# Patient Record
Sex: Female | Born: 1945 | Hispanic: No | Smoking: Never smoker
Health system: Southern US, Community
[De-identification: ages and names within clinical notes are randomized; demographics above are authoritative.]

## PROBLEM LIST (undated history)

## (undated) ENCOUNTER — Emergency Department (HOSPITAL_BASED_OUTPATIENT_CLINIC_OR_DEPARTMENT_OTHER): Admission: EM | Payer: Self-pay | Source: Home / Self Care

## (undated) DIAGNOSIS — I441 Atrioventricular block, second degree: Secondary | ICD-10-CM

## (undated) DIAGNOSIS — I10 Essential (primary) hypertension: Secondary | ICD-10-CM

## (undated) DIAGNOSIS — E785 Hyperlipidemia, unspecified: Secondary | ICD-10-CM

## (undated) HISTORY — DX: Atrioventricular block, second degree: I44.1

---

## 2017-05-12 ENCOUNTER — Emergency Department (HOSPITAL_COMMUNITY): Payer: Medicaid Other

## 2017-05-12 ENCOUNTER — Other Ambulatory Visit: Payer: Self-pay

## 2017-05-12 ENCOUNTER — Inpatient Hospital Stay (HOSPITAL_COMMUNITY)
Admission: EM | Admit: 2017-05-12 | Discharge: 2017-05-15 | DRG: 243 | Disposition: A | Discharge status: Home / Self Care | Payer: Medicaid Other | Attending: Internal Medicine | Admitting: Internal Medicine

## 2017-05-12 ENCOUNTER — Encounter (HOSPITAL_COMMUNITY): Payer: Self-pay | Admitting: Emergency Medicine

## 2017-05-12 DIAGNOSIS — E876 Hypokalemia: Secondary | ICD-10-CM | POA: Diagnosis not present

## 2017-05-12 DIAGNOSIS — Z959 Presence of cardiac and vascular implant and graft, unspecified: Secondary | ICD-10-CM

## 2017-05-12 DIAGNOSIS — R001 Bradycardia, unspecified: Secondary | ICD-10-CM

## 2017-05-12 DIAGNOSIS — R0609 Other forms of dyspnea: Secondary | ICD-10-CM

## 2017-05-12 DIAGNOSIS — Z7982 Long term (current) use of aspirin: Secondary | ICD-10-CM

## 2017-05-12 DIAGNOSIS — R791 Abnormal coagulation profile: Secondary | ICD-10-CM | POA: Diagnosis present

## 2017-05-12 DIAGNOSIS — I441 Atrioventricular block, second degree: Principal | ICD-10-CM | POA: Diagnosis present

## 2017-05-12 DIAGNOSIS — Z79899 Other long term (current) drug therapy: Secondary | ICD-10-CM

## 2017-05-12 DIAGNOSIS — E785 Hyperlipidemia, unspecified: Secondary | ICD-10-CM | POA: Diagnosis present

## 2017-05-12 DIAGNOSIS — Z9289 Personal history of other medical treatment: Secondary | ICD-10-CM

## 2017-05-12 DIAGNOSIS — R5383 Other fatigue: Secondary | ICD-10-CM

## 2017-05-12 DIAGNOSIS — J9811 Atelectasis: Secondary | ICD-10-CM | POA: Diagnosis present

## 2017-05-12 DIAGNOSIS — R531 Weakness: Secondary | ICD-10-CM | POA: Diagnosis present

## 2017-05-12 DIAGNOSIS — E7800 Pure hypercholesterolemia, unspecified: Secondary | ICD-10-CM | POA: Diagnosis present

## 2017-05-12 DIAGNOSIS — I10 Essential (primary) hypertension: Secondary | ICD-10-CM | POA: Diagnosis present

## 2017-05-12 DIAGNOSIS — R110 Nausea: Secondary | ICD-10-CM | POA: Diagnosis not present

## 2017-05-12 DIAGNOSIS — I4891 Unspecified atrial fibrillation: Secondary | ICD-10-CM | POA: Diagnosis present

## 2017-05-12 HISTORY — DX: Hyperlipidemia, unspecified: E78.5

## 2017-05-12 HISTORY — DX: Essential (primary) hypertension: I10

## 2017-05-12 LAB — CBC WITH DIFFERENTIAL/PLATELET
BASOS ABS: 0 10*3/uL (ref 0.0–0.1)
BASOS PCT: 0 %
EOS ABS: 0.1 10*3/uL (ref 0.0–0.7)
EOS PCT: 1 %
HEMATOCRIT: 38.1 % (ref 36.0–46.0)
Hemoglobin: 12 g/dL (ref 12.0–15.0)
Lymphocytes Relative: 34 %
Lymphs Abs: 3.1 10*3/uL (ref 0.7–4.0)
MCH: 30.5 pg (ref 26.0–34.0)
MCHC: 31.5 g/dL (ref 30.0–36.0)
MCV: 96.7 fL (ref 78.0–100.0)
MONO ABS: 0.8 10*3/uL (ref 0.1–1.0)
MONOS PCT: 9 %
NEUTROS ABS: 5 10*3/uL (ref 1.7–7.7)
Neutrophils Relative %: 56 %
PLATELETS: 180 10*3/uL (ref 150–400)
RBC: 3.94 MIL/uL (ref 3.87–5.11)
RDW: 13.6 % (ref 11.5–15.5)
WBC: 9 10*3/uL (ref 4.0–10.5)

## 2017-05-12 LAB — COMPREHENSIVE METABOLIC PANEL
ALBUMIN: 3.8 g/dL (ref 3.5–5.0)
ALT: 28 U/L (ref 14–54)
ANION GAP: 6 (ref 5–15)
AST: 33 U/L (ref 15–41)
Alkaline Phosphatase: 72 U/L (ref 38–126)
BILIRUBIN TOTAL: 1 mg/dL (ref 0.3–1.2)
BUN: 21 mg/dL — AB (ref 6–20)
CHLORIDE: 112 mmol/L — AB (ref 101–111)
CO2: 23 mmol/L (ref 22–32)
Calcium: 9 mg/dL (ref 8.9–10.3)
Creatinine, Ser: 0.91 mg/dL (ref 0.44–1.00)
GFR calc Af Amer: >60 mL/min (ref >60)
GFR calc non Af Amer: >60 mL/min (ref >60)
GLUCOSE: 111 mg/dL — AB (ref 65–99)
POTASSIUM: 3.9 mmol/L (ref 3.5–5.1)
SODIUM: 141 mmol/L (ref 135–145)
TOTAL PROTEIN: 7.1 g/dL (ref 6.5–8.1)

## 2017-05-12 LAB — URINALYSIS, ROUTINE W REFLEX MICROSCOPIC
BILIRUBIN URINE: NEGATIVE
Bacteria, UA: NONE SEEN
Glucose, UA: NEGATIVE mg/dL
KETONES UR: NEGATIVE mg/dL
LEUKOCYTES UA: NEGATIVE
NITRITE: NEGATIVE
PH: 6 (ref 5.0–8.0)
Protein, ur: NEGATIVE mg/dL
SPECIFIC GRAVITY, URINE: 1.003 — AB (ref 1.005–1.030)
Squamous Epithelial / LPF: NONE SEEN

## 2017-05-12 LAB — I-STAT TROPONIN, ED: TROPONIN I, POC: 0 ng/mL (ref 0.00–0.08)

## 2017-05-12 NOTE — ED Triage Notes (Signed)
Patient reports exertional dyspnea , fatigue , generalized weakness and right calf pain onset this week. Denies fever or chills .

## 2017-05-13 ENCOUNTER — Other Ambulatory Visit: Payer: Self-pay

## 2017-05-13 DIAGNOSIS — R001 Bradycardia, unspecified: Secondary | ICD-10-CM | POA: Diagnosis present

## 2017-05-13 DIAGNOSIS — E7800 Pure hypercholesterolemia, unspecified: Secondary | ICD-10-CM | POA: Diagnosis present

## 2017-05-13 DIAGNOSIS — R110 Nausea: Secondary | ICD-10-CM | POA: Diagnosis not present

## 2017-05-13 DIAGNOSIS — Z7982 Long term (current) use of aspirin: Secondary | ICD-10-CM | POA: Diagnosis not present

## 2017-05-13 DIAGNOSIS — R531 Weakness: Secondary | ICD-10-CM | POA: Diagnosis present

## 2017-05-13 DIAGNOSIS — I441 Atrioventricular block, second degree: Secondary | ICD-10-CM | POA: Diagnosis present

## 2017-05-13 DIAGNOSIS — E785 Hyperlipidemia, unspecified: Secondary | ICD-10-CM | POA: Diagnosis present

## 2017-05-13 DIAGNOSIS — Z79899 Other long term (current) drug therapy: Secondary | ICD-10-CM | POA: Diagnosis not present

## 2017-05-13 DIAGNOSIS — J9811 Atelectasis: Secondary | ICD-10-CM | POA: Diagnosis present

## 2017-05-13 DIAGNOSIS — I10 Essential (primary) hypertension: Secondary | ICD-10-CM | POA: Diagnosis present

## 2017-05-13 DIAGNOSIS — R791 Abnormal coagulation profile: Secondary | ICD-10-CM | POA: Diagnosis present

## 2017-05-13 DIAGNOSIS — I4891 Unspecified atrial fibrillation: Secondary | ICD-10-CM | POA: Diagnosis present

## 2017-05-13 DIAGNOSIS — E876 Hypokalemia: Secondary | ICD-10-CM | POA: Diagnosis not present

## 2017-05-13 LAB — MRSA PCR SCREENING: MRSA by PCR: NEGATIVE

## 2017-05-13 LAB — CBC
HCT: 34.4 % — ABNORMAL LOW (ref 36.0–46.0)
HEMOGLOBIN: 10.7 g/dL — AB (ref 12.0–15.0)
MCH: 29.4 pg (ref 26.0–34.0)
MCHC: 31.1 g/dL (ref 30.0–36.0)
MCV: 94.5 fL (ref 78.0–100.0)
Platelets: 165 10*3/uL (ref 150–400)
RBC: 3.64 MIL/uL — AB (ref 3.87–5.11)
RDW: 13.4 % (ref 11.5–15.5)
WBC: 8.8 10*3/uL (ref 4.0–10.5)

## 2017-05-13 LAB — CREATININE, SERUM
CREATININE: 0.79 mg/dL (ref 0.44–1.00)
GFR calc Af Amer: >60 mL/min (ref >60)

## 2017-05-13 LAB — D-DIMER, QUANTITATIVE: D-Dimer, Quant: 1.47 ug/mL-FEU — ABNORMAL HIGH (ref 0.00–0.50)

## 2017-05-13 LAB — TSH: TSH: 2.964 u[IU]/mL (ref 0.350–4.500)

## 2017-05-13 MED ORDER — IRBESARTAN 150 MG PO TABS
150.0000 mg | ORAL_TABLET | Freq: Every day | ORAL | Status: DC
  Administered 2017-05-13 – 2017-05-15 (×3): 150 mg via ORAL
  Filled 2017-05-13 (×2): qty 1

## 2017-05-13 MED ORDER — METOPROLOL SUCCINATE ER 100 MG PO TB24: 100.0000 mg | ORAL_TABLET | Freq: Every day | ORAL | Status: DC

## 2017-05-13 MED ORDER — IRBESARTAN 150 MG PO TABS
300.0000 mg | ORAL_TABLET | Freq: Every day | ORAL | Status: DC
  Filled 2017-05-13: qty 2

## 2017-05-13 MED ORDER — ACETAMINOPHEN 325 MG PO TABS: 650.0000 mg | ORAL_TABLET | Freq: Four times a day (QID) | ORAL | Status: DC | PRN

## 2017-05-13 MED ORDER — FELODIPINE ER 2.5 MG PO TB24
2.5000 mg | ORAL_TABLET | Freq: Every day | ORAL | Status: DC
  Administered 2017-05-13 – 2017-05-15 (×3): 2.5 mg via ORAL
  Filled 2017-05-13 (×3): qty 1

## 2017-05-13 MED ORDER — ENOXAPARIN SODIUM 40 MG/0.4ML ~~LOC~~ SOLN
40.0000 mg | SUBCUTANEOUS | Status: DC
  Administered 2017-05-13: 40 mg via SUBCUTANEOUS
  Filled 2017-05-13: qty 0.4

## 2017-05-13 MED ORDER — ACETAMINOPHEN 650 MG RE SUPP: 650.0000 mg | Freq: Four times a day (QID) | RECTAL | Status: DC | PRN

## 2017-05-13 MED ORDER — METOCLOPRAMIDE HCL 5 MG/ML IJ SOLN
10.0000 mg | Freq: Once | INTRAMUSCULAR | Status: AC
  Administered 2017-05-13: 10 mg via INTRAVENOUS
  Filled 2017-05-13: qty 2

## 2017-05-13 MED ORDER — ATORVASTATIN CALCIUM 20 MG PO TABS
20.0000 mg | ORAL_TABLET | Freq: Every day | ORAL | Status: DC
Start: 2017-05-13 — End: 2017-05-15
  Administered 2017-05-13 – 2017-05-14 (×2): 20 mg via ORAL
  Filled 2017-05-13 (×3): qty 1

## 2017-05-13 MED ORDER — ASPIRIN EC 81 MG PO TBEC
81.0000 mg | DELAYED_RELEASE_TABLET | Freq: Every day | ORAL | Status: DC
  Administered 2017-05-13: 81 mg via ORAL
  Filled 2017-05-13: qty 1

## 2017-05-13 MED ORDER — SODIUM CHLORIDE 0.9 % IV SOLN
INTRAVENOUS | Status: DC
  Administered 2017-05-13 – 2017-05-14 (×3): via INTRAVENOUS

## 2017-05-13 MED ORDER — FUROSEMIDE 10 MG/ML IJ SOLN: 40.0000 mg | Freq: Once | INTRAMUSCULAR | Status: DC

## 2017-05-13 NOTE — ED Provider Notes (Signed)
[facility] Note   CSN: [id number] Arrival date & time: 05/12/17  2129     History   Chief Complaint Chief Complaint  Patient presents with  . Shortness of Breath  . Fatigue    HPI Brandi Ayers is a 71 y.o. female.  The history is provided by the patient. A language interpreter was used.  She has history of hypertension and hyperlipidemia, and is visiting from [place].  Over the last several days, she has noted shortness of breath when she walks and feeling generally weak.  She denies chest pain, heaviness, tightness, pressure.  She denies nausea or vomiting.  She denies feeling dizzy or lightheaded.  On review of her medications, she is on a long-acting version of metoprolol.  Past Medical History:  Diagnosis Date  . Hyperlipidemia   . Hypertension     There are no active problems to display for this patient.   ** The histories are not reviewed yet. Please review them in the "History" navigator section and refresh this SmartLink.  OB History    No data available       Home Medications    Prior to Admission medications   Not on File    Family History No family history on file.  Social History Social History   Tobacco Use  . Smoking status: Never Smoker  . Smokeless tobacco: Never Used  Substance Use Topics  . Alcohol use: No    Frequency: Never  . Drug use: No     Allergies   Patient has no known allergies.   Review of Systems Review of Systems  All other systems reviewed and are negative.    Physical Exam Updated Vital Signs BP (!) 173/53   Pulse (!) 37   Temp 98.6 F (37 C) (Oral)   Resp (!) 23   SpO2 98%   Physical Exam  Nursing note and vitals reviewed.  71 year old female, resting comfortably and in no acute distress. Vital signs are significant for bradycardia, tachypnea, hypertension. Oxygen saturation is 98%, which is normal. Head is normocephalic and atraumatic. PERRLA,  EOMI. Oropharynx is clear. Neck is nontender and supple without adenopathy or JVD. Back is nontender and there is no CVA tenderness. Lungs are clear without rales, wheezes, or rhonchi. Chest is nontender. Heart is bradycardic without murmur. Abdomen is soft, flat, nontender without masses or hepatosplenomegaly and peristalsis is normoactive. Extremities have no cyanosis or edema, full range of motion is present. Skin is warm and dry without rash. Neurologic: Mental status is normal, cranial nerves are intact, there are no motor or sensory deficits.  ED Treatments / Results  Labs (all labs ordered are listed, but only abnormal results are displayed) Labs Reviewed  COMPREHENSIVE METABOLIC PANEL - Abnormal; Notable for the following components:      Result Value   Chloride 112 (*)    Glucose, Bld 111 (*)    BUN 21 (*)    All other components within normal limits  URINALYSIS, ROUTINE W REFLEX MICROSCOPIC - Abnormal; Notable for the following components:   Color, Urine COLORLESS (*)    Specific Gravity, Urine 1.003 (*)    Hgb urine dipstick SMALL (*)    All other components within normal limits  CBC WITH DIFFERENTIAL/PLATELET  I-STAT TROPONIN, ED    EKG  EKG Interpretation  Date/Time:  Thursday May 12 2017 21:34:37 EST Ventricular Rate:  42 PR Interval:  274 QRS Duration: 140 QT Interval:  542  QTC Calculation: 452 R Axis:   67 Text Interpretation:  Marked sinus bradycardia with 1st degree A-V block Right bundle branch block Abnormal ECG No old tracing to compare Confirmed by Riki Gehring ([id number]) on 05/13/2017 12:59:03 AM  Dione Booze     Radiology Dg Chest 2 View  Result Date: 05/12/2017 CLINICAL DATA:  Acute onset of shortness of breath with exertion. Generalized weakness and fatigue. EXAM: CHEST  2 VIEW COMPARISON:  None. FINDINGS: The lungs are well-aerated. Vascular congestion is noted. Increased interstitial markings may reflect mild interstitial edema. A small 1.0 cm  nodular opacity is suggested at the left hilum. There is no evidence of pleural effusion or pneumothorax. The heart is borderline normal in size. No acute osseous abnormalities are seen. IMPRESSION: 1. Vascular congestion. Increased interstitial markings may reflect mild interstitial edema. 2. Small 1.0 cm nodular opacity suggested at the left hilum. Followup PA and lateral chest X-ray is recommended in 3-4 weeks following treatment of interstitial edema to ensure resolution and exclude underlying malignancy. Electronically Signed   By: Roanna RaiderJeffery  Chang M.D.   On: 05/12/2017 22:14    Procedures Procedures (including critical care time)  Medications Ordered in ED Medications - No data to display   Initial Impression / Assessment and Plan / ED Course  I have reviewed the triage vital signs and the nursing notes.  Pertinent labs & imaging results that were available during my care of the patient were reviewed by me and considered in my medical decision making (see chart for details).  Fatigue and dyspnea with severe bradycardia.  Bradycardia is likely the source of her symptoms.  She is on a beta-blocker, so that is the most likely cause.  Also need to consider possible underlying sick sinus syndrome.  She will need to be admitted for cardiac monitoring.  If bradycardia does not respond to holding beta-blocker, may need to consider permanent pacemaker.  Case is discussed with Dr. Toniann FailKakrakandy, of [facility], who agrees to admit the patient.  He does request cardiology consultation and TSH level.  These are ordered.  Final Clinical Impressions(s) / ED Diagnoses   Final diagnoses:  Sinus bradycardia  Weakness  Fatigue, unspecified type  Dyspnea on exertion    ED Discharge Orders    None       Dione BoozeGlick, Honore Wipperfurth, MD 05/13/17 [phone]

## 2017-05-13 NOTE — Progress Notes (Signed)
Patient with c/o nausea. Blount on team notified and ordered Reglan for nausea. Patient lying in bed. Report of HR dropping to 29 with a 2.86 sec pause reported to Office Depot[employer]ount. See strip. Will continue to monitor closely.

## 2017-05-13 NOTE — Consult Note (Signed)
Cardiology Consultation:   Patient ID: Brandi Ayers; [id number]; 04/11/1946   Admit date: 05/12/2017 Date of Consult: 05/13/2017  Primary Care Provider: Patient, No Pcp Per Primary Cardiologist: No primary care provider on file.  Primary Electrophysiologist:  none  Patient Profile:   Brandi Ayers is a 71 y.o. female with a hx of HTN, who is being seen today for the evaluation of fatigue, bradycardia  at the request of ER MD  Dr Preston FleetingGlick.  History of Present Illness:   Brandi Ayers  is a 71 y.o. female.  The history is provided by the patient. A language interpreter was used.   She has history of hypertension and hyperlipidemia, and is visiting from [place].    Over the last several days, she has noted shortness of breath when she walks and feeling generally weak.  She denies chest pain, heaviness, tightness, pressure.  She denies nausea or vomiting.  She denies feeling dizzy or lightheaded.  On review of her medications, she is on a long-acting version of metoprolol.  H/o  Right leg/calf  Pain , from nursing  History  Noted.  Awake, alert, oriented and  Vitals  130/60  Pulse  38-40 EKG- Possible  Underlying  Wenckeback, 2:1AV block   Social History:   Social History   Socioeconomic History  . Marital status: Widowed    Spouse name: Not on file  . Number of children: Not on file  . Years of education: Not on file  . Highest education level: Not on file  Social Needs  . Financial resource strain: Not on file  . Food insecurity - worry: Not on file  . Food insecurity - inability: Not on file  . Transportation needs - medical: Not on file  . Transportation needs - non-medical: Not on file  Occupational History  . Not on file  Tobacco Use  . Smoking status: Never Smoker  . Smokeless tobacco: Never Used  Substance and Sexual Activity  . Alcohol use: No    Frequency: Never  . Drug use: No  . Sexual activity: Not on file  Other Topics Concern  . Not on file  Social  History Narrative  . Not on file    Family History:   No family history on file.   ROS:  Please see the history of present illness.  ROS  All other ROS reviewed and negative.   , except  For  WEAKNESS, Fatigue  Physical Exam/Data:   Vitals:   05/13/17 0230 05/13/17 0300 05/13/17 0315 05/13/17 0330  BP: (!) 153/55 (!) 149/55 (!) 146/57 (!) 136/53  Pulse: (!) 35 (!) 34 (!) 30 (!) 34  Resp: 18 (!) 25 18 13   Temp:      TempSrc:      SpO2: 96% 96% 93% 96%   No intake or output data in the 24 hours ending 05/13/17 0415 There were no vitals filed for this visit. There is no height or weight on file to calculate BMI.  General:  Well nourished, well developed,  Appears  fatigued HEENT: normal Lymph: no adenopathy Neck: no JVD Endocrine:  No thryomegaly Vascular: No carotid bruits; FA pulses 2+ bilaterally without bruits  Cardiac:  normal S1, S2; RRR; no murmur Lungs:  clear to auscultation bilaterally, no wheezing, rhonchi or rales  Abd: soft, nontender, no hepatomegaly  Ext: no edema Musculoskeletal:  No deformities, BUE and BLE strength normal and equal Skin: warm and dry  Neuro:  CNs 2-12 intact, no focal abnormalities noted Psych:  Normal affect   EKG:  The EKG was personally reviewed and demonstrates:  2:1  AV  Block , Sinus  Bradycardia, Non  Specific ST  Changes, RBBB  Telemetry:  Telemetry was personally reviewed and demonstrates:   BRADYCARDIA  Relevant CV Studies:  No prior EKG  Or  Cath results Laboratory Data:  Chemistry Recent Labs  Lab 05/12/17 2146  NA 141  K 3.9  CL 112*  CO2 23  GLUCOSE 111*  BUN 21*  CREATININE 0.91  CALCIUM 9.0  GFRNONAA >60  GFRAA >60  ANIONGAP 6    Recent Labs  Lab 05/12/17 2146  PROT 7.1  ALBUMIN 3.8  AST 33  ALT 28  ALKPHOS 72  BILITOT 1.0   Hematology Recent Labs  Lab 05/12/17 2146  WBC 9.0  RBC 3.94  HGB 12.0  HCT 38.1  MCV 96.7  MCH 30.5  MCHC 31.5  RDW 13.6  PLT 180   Cardiac EnzymesNo results  for input(s): TROPONINI in the last 168 hours.  Recent Labs  Lab 05/12/17 2207  TROPIPOC 0.00    BNPNo results for input(s): BNP, PROBNP in the last 168 hours.  DDimer No results for input(s): DDIMER in the last 168 hours. D- DIMER  REPORTED  AS 1.47 at  approx  5 am   Radiology/Studies:  Dg Chest 2 View  Result Date: 05/12/2017 CLINICAL DATA:  Acute onset of shortness of breath with exertion. Generalized weakness and fatigue. EXAM: CHEST  2 VIEW COMPARISON:  None. FINDINGS: The lungs are well-aerated. Vascular congestion is noted. Increased interstitial markings may reflect mild interstitial edema. A small 1.0 cm nodular opacity is suggested at the left hilum. There is no evidence of pleural effusion or pneumothorax. The heart is borderline normal in size. No acute osseous abnormalities are seen. IMPRESSION: 1. Vascular congestion. Increased interstitial markings may reflect mild interstitial edema. 2. Small 1.0 cm nodular opacity suggested at the left hilum. Followup PA and lateral chest X-ray is recommended in 3-4 weeks following treatment of interstitial edema to ensure resolution and exclude underlying malignancy. Electronically Signed   By: Roanna RaiderJeffery  Chang M.D.   On: 05/12/2017 22:14    Assessment and Plan:   BRADYCARDIA   Appears to be  2:1 AV  Block vs  Underlying  Mobitz Type 2, RBBB,    No Hypotension   D- dimer  , recommended  And Either V/Q or CT A , PE  Study  Recommended, especially  As  There  Are  Subtle symptoms of  Exertional dyspnea,  And  >12 hrs  Long  Distance travel.   D/W ER , if  Any  Hypotension arises, consider  Dopamine gtt Echo , cardiac  Enzymes,  EKG  With symptoms  Requested.TSH  ordered Will suggest EP input, to review  Further. Metoprolol on  Hold.   HTN-  Well controlled  Hyperlipidemia- continue  Atorvastatin  Elevated D-Dimer, with questionable h/o Right calf  Pain- Will recommend  DVT  Study  And  Also PE Rule out, as symptoms of Exertional  dyspnea  Are  Present,  Conveyed  recs to ER   For questions or updates, please contact [facility] Please consult [email or web address] for contact info under Cardiology/STEMI.   Signed, Lynwood DawleyAmeeth Kizer Nobbe, MD  05/13/2017 4:15 AM

## 2017-05-13 NOTE — H&P (Signed)
History and Physical    Brandi Ayers AVW:[id number] DOB: 1945-09-21 DOA: 05/12/2017  PCP: Patient, No Pcp Per Patient coming from: home.  Pt lives here and in [place]  Chief Complaint: fatigue, weakness  HPI: Brandi Ayers is a 71 y.o. female with medical history significant of hypertension and hyperlipidemia.  Pt reports she has been short of breath when she is walking for several days.  Pt reports she feels weak all over.  Pt had not noticed that her heart rate was low.  Pt denies any recent illness.  No cough, no fever or chills.  She has not experienced any chest pain.  No history of syncope.  Pt feels her health is over all good.   Pt reports she has no symptoms at rest.  She is visiting here from [place].  Pt reports she lives here and in [place].  No MD here .  Pt has Md in [place].  Pt is on a long acting metoprolol for her blood pressure control.      ED Course: Pt seen and placed on  A monitor in ED.  Pt's heart rate in the 30-40's.    Review of Systems  Constitutional: Negative for chills and fever.  HENT: Negative for congestion, sinus pain and sore throat.   Eyes: Negative.   Respiratory: Negative for cough.   Cardiovascular: Negative for chest pain, palpitations and leg swelling.  Gastrointestinal: Negative for abdominal pain and nausea.  Genitourinary: Negative for dysuria and urgency.  Musculoskeletal: Negative for myalgias.  Skin: Negative for rash.  Neurological: Negative for dizziness, focal weakness and headaches.  Endo/Heme/Allergies: Negative for polydipsia.  Psychiatric/Behavioral: Negative for depression.  All other systems reviewed and are negative.    Ambulatory Status:ambulatory, needs assistance due to low heart rate currently  Past Medical History:  Diagnosis Date  . Hyperlipidemia   . Hypertension       Social History   Socioeconomic History  . Marital status: Widowed    Spouse name: Not on file  . Number of children: Not on file  . Years  of education: Not on file  . Highest education level: Not on file  Social Needs  . Financial resource strain: Not on file  . Food insecurity - worry: Not on file  . Food insecurity - inability: Not on file  . Transportation needs - medical: Not on file  . Transportation needs - non-medical: Not on file  Occupational History  . Not on file  Tobacco Use  . Smoking status: Never Smoker  . Smokeless tobacco: Never Used  Substance and Sexual Activity  . Alcohol use: No    Frequency: Never  . Drug use: No  . Sexual activity: Not on file  Other Topics Concern  . Not on file  Social History Narrative  . Not on file    No Known Allergies  No family history on file.  Prior to Admission medications   Medication Sig Start Date End Date Taking? Authorizing Provider  aspirin EC 81 MG tablet Take 81 mg by mouth daily.   Yes [provider]  atorvastatin (LIPITOR) 20 MG tablet Take 20 mg by mouth daily.   Yes [provider]  candesartan (ATACAND) 16 MG tablet Take 16 mg by mouth 2 (two) times daily.   Yes [provider]  felodipine (PLENDIL) 5 MG 24 hr tablet Take 2.5 mg by mouth daily.   Yes [provider]  metoprolol succinate (TOPROL-XL) 100 MG 24 hr tablet Take 100  mg by mouth daily. Take with or immediately following a meal.   Yes [provider]    Physical Exam: Vitals:   05/13/17 0615 05/13/17 0616 05/13/17 0733 05/13/17 0837  BP:  (!) 167/59 (!) 153/61 (!) 146/51  Pulse:  (!) 33 (!) 34 (!) 32  Resp:  (!) 21 20 (!) 23  Temp: 98.3 F (36.8 C)  98.2 F (36.8 C)   TempSrc: Oral  Oral   SpO2:  96% 96% 95%  Weight: 83.7 kg (184 lb 8.4 oz)        General:  Appears calm and comfortable Eyes:  PERRL, EOMI, normal lids, iris ENT:  grossly normal hearing, lips & tongue, mmm Neck:  no LAD, masses or thyromegaly Cardiovascular:  Bradycardia 38no m/r/g. No LE edema.  Respiratory:   CTA bilaterally, no w/r/r. Normal respiratory  effort. Abdomen:   soft, ntnd, NABS Skin:   no rash or induration seen on limited exam Musculoskeletal:   grossly normal tone BUE/BLE, good ROM, no bony abnormality Psychiatric:   grossly normal mood and affect, speech fluent and appropriate, AOx3 Neurologic:   CN 2-12 grossly intact, moves all extremities in coordinated fashion, sensation intact  Labs on Admission: I have personally reviewed following labs and imaging studies  CBC: Recent Labs  Lab 05/12/17 2146  WBC 9.0  NEUTROABS 5.0  HGB 12.0  HCT 38.1  MCV 96.7  PLT 180   Basic Metabolic Panel: Recent Labs  Lab 05/12/17 2146  NA 141  K 3.9  CL 112*  CO2 23  GLUCOSE 111*  BUN 21*  CREATININE 0.91  CALCIUM 9.0   GFR: CrCl cannot be calculated (Unknown ideal weight.). Liver Function Tests: Recent Labs  Lab 05/12/17 2146  AST 33  ALT 28  ALKPHOS 72  BILITOT 1.0  PROT 7.1  ALBUMIN 3.8   No results for input(s): LIPASE, AMYLASE in the last 168 hours. No results for input(s): AMMONIA in the last 168 hours. Coagulation Profile: No results for input(s): INR, PROTIME in the last 168 hours. Cardiac Enzymes: No results for input(s): CKTOTAL, CKMB, CKMBINDEX, TROPONINI in the last 168 hours. BNP (last 3 results) No results for input(s): PROBNP in the last 8760 hours. HbA1C: No results for input(s): HGBA1C in the last 72 hours. CBG: No results for input(s): GLUCAP in the last 168 hours. Lipid Profile: No results for input(s): CHOL, HDL, LDLCALC, TRIG, CHOLHDL, LDLDIRECT in the last 72 hours. Thyroid Function Tests: Recent Labs    05/13/17 0208  TSH 2.964   Anemia Panel: No results for input(s): VITAMINB12, FOLATE, FERRITIN, TIBC, IRON, RETICCTPCT in the last 72 hours. Urine analysis:    Component Value Date/Time   COLORURINE COLORLESS (A) 05/12/2017 2141   APPEARANCEUR CLEAR 05/12/2017 2141   LABSPEC 1.003 (L) 05/12/2017 2141   PHURINE 6.0 05/12/2017 2141   GLUCOSEU NEGATIVE 05/12/2017 2141    HGBUR SMALL (A) 05/12/2017 2141   BILIRUBINUR NEGATIVE 05/12/2017 2141   KETONESUR NEGATIVE 05/12/2017 2141   PROTEINUR NEGATIVE 05/12/2017 2141   NITRITE NEGATIVE 05/12/2017 2141   LEUKOCYTESUR NEGATIVE 05/12/2017 2141    Creatinine Clearance: CrCl cannot be calculated (Unknown ideal weight.).  Sepsis Labs: @LABRCNTIP (procalcitonin:4,lacticidven:4) )No results found for this or any previous visit (from the past 240 hour(s)).   Radiological Exams on Admission: Dg Chest 2 View  Result Date: 05/12/2017 CLINICAL DATA:  Acute onset of shortness of breath with exertion. Generalized weakness and fatigue. EXAM: CHEST  2 VIEW COMPARISON:  None. FINDINGS: The lungs are  well-aerated. Vascular congestion is noted. Increased interstitial markings may reflect mild interstitial edema. A small 1.0 cm nodular opacity is suggested at the left hilum. There is no evidence of pleural effusion or pneumothorax. The heart is borderline normal in size. No acute osseous abnormalities are seen. IMPRESSION: 1. Vascular congestion. Increased interstitial markings may reflect mild interstitial edema. 2. Small 1.0 cm nodular opacity suggested at the left hilum. Followup PA and lateral chest X-ray is recommended in 3-4 weeks following treatment of interstitial edema to ensure resolution and exclude underlying malignancy. Electronically Signed   By: Roanna RaiderJeffery  Chang M.D.   On: 05/12/2017 22:14    EKG: Independently reviewed. Bradycardia  Assessment/Plan Active Problems:   Bradycardia  --Bradycardia may be due to metoprolol.  I will hold metoprolol to see if bradycardia improves.  Sick sinus syndrome considered.  Cardiology to follow.    Hypertension  --Hold metopolol for now  Hypercholesterolemia --Continue lipitor        DVT prophylaxis: lovenox  Code Status: Full Family Communication: Son in [place]  Not currently present Disposition Plan: Admission Consults called: Cardiology  Admission status:  Inpatient   Langston MaskerKaren Sofia PA-C [facility]  If 7PM-7AM, please contact night-coverage [email or web address] Password [email or web address]  05/13/2017, 9:31 AM

## 2017-05-13 NOTE — ED Notes (Signed)
ED Provider at bedside. 

## 2017-05-13 NOTE — Progress Notes (Addendum)
    Pt seen by cards fellow at 4 am today.  Pt still bradycardic, HR drops into the high 20s while asleep. No obvious sx. pta on Toprol XL 100 mg qd, d/c'd. Spoke w/ pt using video interpreter, she took the Toprol XL 100 mg yesterday am. Pt wonders if she will get her BP meds, advised her we may need to hold them to keep her BP from dropping too low.  Pt currently asymptomatic.  Continue to follow HR  - review VS w/ MD later today to see if any additional eval needed. - parameters for Plendil and Avapro, would allow BP to run higher than normal for now.  Brandi DemarkRhonda Barrett, PA-C 05/13/2017 9:47 AM Beeper [phone]  Agree that we should hold her beta blocker today and follow her HR. Reassess in am. No indication for pacemaker today as her BP is stable and she is asymptomatic.   Verne CarrowChristopher Ayers 05/13/2017 10:56 AM

## 2017-05-13 NOTE — Progress Notes (Signed)
This is an addendum of H&P dictated by Advanced Practitioner Cheron SchaumannSofia, Leslie PA-C.  Brandi Ayers is a 71 y.o. female with medical history significant of hypertension and hyperlipidemia.  Pt reports she has been short of breath when she is walking for several days.  Pt reports she feels weak all over.  Pt had not noticed that her heart rate was low.  Pt denies any recent illness.  No cough, no fever or chills. Patient on Toprol XL 100 mg daily. Cardiology consulted and following.  Patient seen and examined with no family members at bedside. Denies chest pain. Last time she took her beta blocker was yesterday morning.   Agree with assessment and plan done by Advanced Practitioner.

## 2017-05-13 NOTE — Progress Notes (Signed)
Blount NP in to see patient. Bradycardia now symptoms.  Patient stating "I feel like I'm dying." EKG performed. Blount stated she will call cardiology.

## 2017-05-13 NOTE — ED Notes (Signed)
When pt gets roomed upstairs call son at [phone]

## 2017-05-14 ENCOUNTER — Inpatient Hospital Stay (HOSPITAL_COMMUNITY): Payer: Medicaid Other

## 2017-05-14 ENCOUNTER — Encounter (HOSPITAL_COMMUNITY): Payer: Self-pay | Admitting: *Deleted

## 2017-05-14 ENCOUNTER — Other Ambulatory Visit: Payer: Self-pay

## 2017-05-14 ENCOUNTER — Encounter (HOSPITAL_COMMUNITY)
Admission: EM | Disposition: A | Discharge status: Home / Self Care | Payer: Self-pay | Source: Home / Self Care | Attending: Internal Medicine

## 2017-05-14 DIAGNOSIS — R0600 Dyspnea, unspecified: Secondary | ICD-10-CM

## 2017-05-14 DIAGNOSIS — R5383 Other fatigue: Secondary | ICD-10-CM

## 2017-05-14 DIAGNOSIS — R0609 Other forms of dyspnea: Secondary | ICD-10-CM

## 2017-05-14 DIAGNOSIS — I442 Atrioventricular block, complete: Secondary | ICD-10-CM

## 2017-05-14 DIAGNOSIS — R531 Weakness: Secondary | ICD-10-CM

## 2017-05-14 HISTORY — PX: PACEMAKER IMPLANT: EP1218

## 2017-05-14 LAB — COMPREHENSIVE METABOLIC PANEL
ALBUMIN: 3.5 g/dL (ref 3.5–5.0)
ALK PHOS: 67 U/L (ref 38–126)
ALT: 19 U/L (ref 14–54)
ANION GAP: 14 (ref 5–15)
AST: 21 U/L (ref 15–41)
BUN: 9 mg/dL (ref 6–20)
CHLORIDE: 107 mmol/L (ref 101–111)
CO2: 18 mmol/L — AB (ref 22–32)
Calcium: 8.7 mg/dL — ABNORMAL LOW (ref 8.9–10.3)
Creatinine, Ser: 0.73 mg/dL (ref 0.44–1.00)
GFR calc Af Amer: >60 mL/min (ref >60)
GFR calc non Af Amer: >60 mL/min (ref >60)
GLUCOSE: 113 mg/dL — AB (ref 65–99)
POTASSIUM: 3.4 mmol/L — AB (ref 3.5–5.1)
SODIUM: 139 mmol/L (ref 135–145)
Total Bilirubin: 1.7 mg/dL — ABNORMAL HIGH (ref 0.3–1.2)
Total Protein: 6.3 g/dL — ABNORMAL LOW (ref 6.5–8.1)

## 2017-05-14 LAB — CBC
HEMATOCRIT: 35.9 % — AB (ref 36.0–46.0)
HEMOGLOBIN: 11.5 g/dL — AB (ref 12.0–15.0)
MCH: 30.7 pg (ref 26.0–34.0)
MCHC: 32 g/dL (ref 30.0–36.0)
MCV: 95.7 fL (ref 78.0–100.0)
Platelets: 157 10*3/uL (ref 150–400)
RBC: 3.75 MIL/uL — AB (ref 3.87–5.11)
RDW: 13.6 % (ref 11.5–15.5)
WBC: 11.8 10*3/uL — ABNORMAL HIGH (ref 4.0–10.5)

## 2017-05-14 LAB — ECHOCARDIOGRAM COMPLETE
HEIGHTINCHES: 65 in
WEIGHTICAEL: 2952.4003 [oz_av]

## 2017-05-14 LAB — HEPARIN LEVEL (UNFRACTIONATED): Heparin Unfractionated: 0.18 IU/mL — ABNORMAL LOW (ref 0.30–0.70)

## 2017-05-14 LAB — TROPONIN I
TROPONIN I: 0.03 ng/mL — AB (ref <0.03)
Troponin I: 0.1 ng/mL (ref <0.03)
Troponin I: 0.11 ng/mL (ref <0.03)

## 2017-05-14 SURGERY — PACEMAKER IMPLANT
Anesthesia: LOCAL

## 2017-05-14 MED ORDER — ACETAMINOPHEN 325 MG PO TABS: 325.0000 mg | ORAL_TABLET | ORAL | Status: DC | PRN

## 2017-05-14 MED ORDER — SODIUM CHLORIDE 0.9% FLUSH
3.0000 mL | Freq: Two times a day (BID) | INTRAVENOUS | Status: DC
  Administered 2017-05-14: 3 mL via INTRAVENOUS

## 2017-05-14 MED ORDER — CEFAZOLIN SODIUM-DEXTROSE 1-4 GM/50ML-% IV SOLN
1.0000 g | Freq: Four times a day (QID) | INTRAVENOUS | Status: AC
  Administered 2017-05-14 – 2017-05-15 (×3): 1 g via INTRAVENOUS
  Filled 2017-05-14 (×3): qty 50

## 2017-05-14 MED ORDER — CEFAZOLIN SODIUM-DEXTROSE 2-4 GM/100ML-% IV SOLN
INTRAVENOUS | Status: AC
  Filled 2017-05-14: qty 100

## 2017-05-14 MED ORDER — LIDOCAINE HCL (PF) 1 % IJ SOLN
INTRAMUSCULAR | Status: DC | PRN
  Administered 2017-05-14: 60 mL

## 2017-05-14 MED ORDER — HYDROCODONE-ACETAMINOPHEN 5-325 MG PO TABS: 1.0000 | ORAL_TABLET | ORAL | Status: DC | PRN

## 2017-05-14 MED ORDER — SODIUM CHLORIDE 0.9 % IR SOLN
Status: AC
  Filled 2017-05-14: qty 2

## 2017-05-14 MED ORDER — IOPAMIDOL (ISOVUE-370) INJECTION 76%
INTRAVENOUS | Status: DC | PRN
  Administered 2017-05-14: 15 mL via INTRAVENOUS

## 2017-05-14 MED ORDER — HEPARIN (PORCINE) IN NACL 100-0.45 UNIT/ML-% IJ SOLN
1100.0000 [IU]/h | INTRAMUSCULAR | Status: DC
  Administered 2017-05-14: 1100 [IU]/h via INTRAVENOUS
  Filled 2017-05-14: qty 250

## 2017-05-14 MED ORDER — HEPARIN BOLUS VIA INFUSION
4000.0000 [IU] | Freq: Once | INTRAVENOUS | Status: AC
  Administered 2017-05-14: 4000 [IU] via INTRAVENOUS
  Filled 2017-05-14: qty 4000

## 2017-05-14 MED ORDER — SODIUM CHLORIDE 0.9 % IV SOLN: 250.0000 mL | INTRAVENOUS | Status: DC | PRN

## 2017-05-14 MED ORDER — TECHNETIUM TC 99M DIETHYLENETRIAME-PENTAACETIC ACID
32.3000 | Freq: Once | INTRAVENOUS | Status: AC | PRN
  Administered 2017-05-14: 32.3 via RESPIRATORY_TRACT

## 2017-05-14 MED ORDER — HEPARIN (PORCINE) IN NACL 2-0.9 UNIT/ML-% IJ SOLN
INTRAMUSCULAR | Status: AC | PRN
  Administered 2017-05-14: 500 mL

## 2017-05-14 MED ORDER — TECHNETIUM TO 99M ALBUMIN AGGREGATED
4.3100 | Freq: Once | INTRAVENOUS | Status: AC | PRN
  Administered 2017-05-14: 4.31 via INTRAVENOUS

## 2017-05-14 MED ORDER — IOPAMIDOL (ISOVUE-370) INJECTION 76%
INTRAVENOUS | Status: AC
  Filled 2017-05-14: qty 50

## 2017-05-14 MED ORDER — SODIUM CHLORIDE 0.9 % IR SOLN
Status: DC | PRN
  Administered 2017-05-14: 13:00:00

## 2017-05-14 MED ORDER — HEPARIN (PORCINE) IN NACL 2-0.9 UNIT/ML-% IJ SOLN
INTRAMUSCULAR | Status: AC
  Filled 2017-05-14: qty 500

## 2017-05-14 MED ORDER — DOPAMINE-DEXTROSE 3.2-5 MG/ML-% IV SOLN
0.0000 ug/kg/min | INTRAVENOUS | Status: DC
  Administered 2017-05-14: 1 ug/kg/min via INTRAVENOUS
  Filled 2017-05-14: qty 250

## 2017-05-14 MED ORDER — SODIUM CHLORIDE 0.9% FLUSH: 3.0000 mL | INTRAVENOUS | Status: DC | PRN

## 2017-05-14 MED ORDER — LIDOCAINE HCL (PF) 1 % IJ SOLN
INTRAMUSCULAR | Status: AC
  Filled 2017-05-14: qty 60

## 2017-05-14 MED ORDER — CEFAZOLIN SODIUM-DEXTROSE 2-3 GM-%(50ML) IV SOLR
INTRAVENOUS | Status: DC | PRN
  Administered 2017-05-14: 2 g via INTRAVENOUS

## 2017-05-14 MED ORDER — ONDANSETRON HCL 4 MG/2ML IJ SOLN: 4.0000 mg | Freq: Four times a day (QID) | INTRAMUSCULAR | Status: DC | PRN

## 2017-05-14 MED ORDER — HYDRALAZINE HCL 20 MG/ML IJ SOLN: 5.0000 mg | INTRAMUSCULAR | Status: DC | PRN

## 2017-05-14 SURGICAL SUPPLY — 8 items
CABLE SURGICAL S-101-97-12 (CABLE) ×2 IMPLANT
IPG PACE AZUR XT DR MRI W1DR01 (Pacemaker) ×1 IMPLANT
LEAD CAPSURE NOVUS 45CM (Lead) ×2 IMPLANT
LEAD CAPSURE NOVUS 5076-58CM (Lead) ×2 IMPLANT
PACE AZURE XT DR MRI W1DR01 (Pacemaker) ×2 IMPLANT
PAD DEFIB LIFELINK (PAD) ×2 IMPLANT
SHEATH CLASSIC 7F (SHEATH) ×4 IMPLANT
TRAY PACEMAKER INSERTION (PACKS) ×2 IMPLANT

## 2017-05-14 NOTE — H&P (View-Only) (Signed)
ELECTROPHYSIOLOGY CONSULT NOTE    Primary Care Physician: Patient, No Pcp Per Referring Physician:  Dr Cristina GongVedre  Admit Date: 05/12/2017  Reason for consultation:  AV block  Brandi Ayers is a 71 y.o. female with a h/o HTN and HL now admitted with symptomatic bradycardia.  She last took her toprol 05/12/17.  Despite washout, she continues to have sinus bradycardia as well as frequent 2:1 AV block.  Her son reports symptoms of fatigue, SOB, and severe N/V for 4 days.  Upon arrival, she was found to have 2:1 AV block.  toprol has been discontinued.  She continues to be progressively ill.   Past Medical History:  Diagnosis Date  . Hyperlipidemia   . Hypertension    History reviewed. No pertinent surgical history.  Marland Kitchen. atorvastatin  20 mg Oral q1800  . felodipine  2.5 mg Oral Daily  . irbesartan  150 mg Oral Daily   . sodium chloride 50 mL/hr at 05/14/17 0800  . DOPamine 0 mcg/kg/min (05/14/17 0800)    No Known Allergies  Social History   Socioeconomic History  . Marital status: Widowed    Spouse name: Not on file  . Number of children: Not on file  . Years of education: Not on file  . Highest education level: Not on file  Social Needs  . Financial resource strain: Not on file  . Food insecurity - worry: Not on file  . Food insecurity - inability: Not on file  . Transportation needs - medical: Not on file  . Transportation needs - non-medical: Not on file  Occupational History  . Not on file  Tobacco Use  . Smoking status: Never Smoker  . Smokeless tobacco: Never Used  Substance and Sexual Activity  . Alcohol use: No    Frequency: Never  . Drug use: No  . Sexual activity: Not on file  Other Topics Concern  . Not on file  Social History Narrative  . Not on file    History reviewed. No pertinent family history.  ROS- All systems are reviewed and negative except as per the HPI above  Physical Exam: Telemetry:  Sinus brady as well as occasional 2:1 AV block, with  V rates 30s-40s Vitals:   05/14/17 0000 05/14/17 0400 05/14/17 0415 05/14/17 0812  BP:  (!) 167/48 (!) 167/48 (!) 153/52  Pulse: (!) 37 (!) 40 (!) 41 63  Resp: (!) 22 18 18  (!) 21  Temp:   99 F (37.2 C) 99.8 F (37.7 C)  TempSrc:   Oral Oral  SpO2: 94% 90% 93% 93%  Weight:      Height:        GEN- The patient is ill appearing  Head- normocephalic, atraumatic Eyes-  Sclera clear, conjunctiva pink Ears- hearing intact Oropharynx- clear Neck- supple,   Lungs- Clear to ausculation bilaterally, normal work of breathing Heart- bradycardic GI- soft, NT, ND, + BS Extremities- no clubbing, cyanosis, or edema MS- no significant deformity or atrophy Skin- no rash or lesion Psych- euthymic mood, full affect Neuro- strength and sensation are intact  EKGs in epic are reviewed and reveal sinus with first degree AV block and RBBB, second ecg reveals 2:1 AV block.  3rd ekg reveals afib with complete heart block  Labs:   Lab Results  Component Value Date   WBC 11.8 (H) 05/14/2017   HGB 11.5 (L) 05/14/2017   HCT 35.9 (L) 05/14/2017   MCV 95.7 05/14/2017   PLT 157 05/14/2017    Recent Labs  Lab 05/14/17 0608  NA 139  K 3.4*  CL 107  CO2 18*  BUN 9  CREATININE 0.73  CALCIUM 8.7*  PROT 6.3*  BILITOT 1.7*  ALKPHOS 67  ALT 19  AST 21  GLUCOSE 113*   Lab Results  Component Value Date   TROPONINI 0.03 (HH) 05/14/2017   No results found for: CHOL No results found for: HDL No results found for: LDLCALC No results found for: TRIG No results found for: CHOLHDL No results found for: LDLDIRECT     Echo:  I have personally reviewed bedside images which reveal Ef 60%, no WMA,  Aortic valve gradient is high however valve seems to move.  May be due to bradycardia   ASSESSMENT AND PLAN:   1. Second degree AV block, mobitz II The patient has symptomatic bradycardia.  No reversible causes have been found.  I would therefore recommend pacemaker implantation at this time.  Risks,  benefits, alternatives to pacemaker implantation were discussed in detail with the patient and her family today using [employer] video translator today. The patient understands that the risks include but are not limited to bleeding, infection, pneumothorax, perforation, tamponade, vascular damage, renal failure, MI, stroke, death,  and lead dislodgement and wishes to proceed urgently at this time.  I have spoken with Dr Gherghe.  We will stop heparin drip at this time.   Charda Janis, MD 05/14/2017  11:11 AM   

## 2017-05-14 NOTE — Interval H&P Note (Signed)
History and Physical Interval Note:  05/14/2017 12:26 PM  Brandi Ayers  has presented today for surgery, with the diagnosis of bradycardia  The various methods of treatment have been discussed with the patient and family. After consideration of risks, benefits and other options for treatment, the patient has consented to  Procedure(s): PACEMAKER IMPLANT (N/A) as a surgical intervention .  The patient's history has been reviewed, patient examined, no change in status, stable for surgery.  I have reviewed the patient's chart and labs.  Questions were answered to the patient's satisfaction.     Hillis RangeJames Verne Lanuza

## 2017-05-14 NOTE — Consult Note (Signed)
ELECTROPHYSIOLOGY CONSULT NOTE    Primary Care Physician: Patient, No Pcp Per Referring Physician:  Dr Cristina GongVedre  Admit Date: 05/12/2017  Reason for consultation:  AV block  Brandi Ayers is a 71 y.o. female with a h/o HTN and HL now admitted with symptomatic bradycardia.  She last took her toprol 05/12/17.  Despite washout, she continues to have sinus bradycardia as well as frequent 2:1 AV block.  Her son reports symptoms of fatigue, SOB, and severe N/V for 4 days.  Upon arrival, she was found to have 2:1 AV block.  toprol has been discontinued.  She continues to be progressively ill.   Past Medical History:  Diagnosis Date  . Hyperlipidemia   . Hypertension    History reviewed. No pertinent surgical history.  Marland Kitchen. atorvastatin  20 mg Oral q1800  . felodipine  2.5 mg Oral Daily  . irbesartan  150 mg Oral Daily   . sodium chloride 50 mL/hr at 05/14/17 0800  . DOPamine 0 mcg/kg/min (05/14/17 0800)    No Known Allergies  Social History   Socioeconomic History  . Marital status: Widowed    Spouse name: Not on file  . Number of children: Not on file  . Years of education: Not on file  . Highest education level: Not on file  Social Needs  . Financial resource strain: Not on file  . Food insecurity - worry: Not on file  . Food insecurity - inability: Not on file  . Transportation needs - medical: Not on file  . Transportation needs - non-medical: Not on file  Occupational History  . Not on file  Tobacco Use  . Smoking status: Never Smoker  . Smokeless tobacco: Never Used  Substance and Sexual Activity  . Alcohol use: No    Frequency: Never  . Drug use: No  . Sexual activity: Not on file  Other Topics Concern  . Not on file  Social History Narrative  . Not on file    History reviewed. No pertinent family history.  ROS- All systems are reviewed and negative except as per the HPI above  Physical Exam: Telemetry:  Sinus brady as well as occasional 2:1 AV block, with  V rates 30s-40s Vitals:   05/14/17 0000 05/14/17 0400 05/14/17 0415 05/14/17 0812  BP:  (!) 167/48 (!) 167/48 (!) 153/52  Pulse: (!) 37 (!) 40 (!) 41 63  Resp: (!) 22 18 18  (!) 21  Temp:   99 F (37.2 C) 99.8 F (37.7 C)  TempSrc:   Oral Oral  SpO2: 94% 90% 93% 93%  Weight:      Height:        GEN- The patient is ill appearing  Head- normocephalic, atraumatic Eyes-  Sclera clear, conjunctiva pink Ears- hearing intact Oropharynx- clear Neck- supple,   Lungs- Clear to ausculation bilaterally, normal work of breathing Heart- bradycardic GI- soft, NT, ND, + BS Extremities- no clubbing, cyanosis, or edema MS- no significant deformity or atrophy Skin- no rash or lesion Psych- euthymic mood, full affect Neuro- strength and sensation are intact  EKGs in epic are reviewed and reveal sinus with first degree AV block and RBBB, second ecg reveals 2:1 AV block.  3rd ekg reveals afib with complete heart block  Labs:   Lab Results  Component Value Date   WBC 11.8 (H) 05/14/2017   HGB 11.5 (L) 05/14/2017   HCT 35.9 (L) 05/14/2017   MCV 95.7 05/14/2017   PLT 157 05/14/2017    Recent Labs  Lab 05/14/17 0608  NA 139  K 3.4*  CL 107  CO2 18*  BUN 9  CREATININE 0.73  CALCIUM 8.7*  PROT 6.3*  BILITOT 1.7*  ALKPHOS 67  ALT 19  AST 21  GLUCOSE 113*   Lab Results  Component Value Date   TROPONINI 0.03 (HH) 05/14/2017   No results found for: CHOL No results found for: HDL No results found for: LDLCALC No results found for: TRIG No results found for: CHOLHDL No results found for: LDLDIRECT     Echo:  I have personally reviewed bedside images which reveal Ef 60%, no WMA,  Aortic valve gradient is high however valve seems to move.  May be due to bradycardia   ASSESSMENT AND PLAN:   1. Second degree AV block, mobitz II The patient has symptomatic bradycardia.  No reversible causes have been found.  I would therefore recommend pacemaker implantation at this time.  Risks,  benefits, alternatives to pacemaker implantation were discussed in detail with the patient and her family today using [employer] video translator today. The patient understands that the risks include but are not limited to bleeding, infection, pneumothorax, perforation, tamponade, vascular damage, renal failure, MI, stroke, death,  and lead dislodgement and wishes to proceed urgently at this time.  I have spoken with Dr Elvera LennoxGherghe.  We will stop heparin drip at this time.   Hillis RangeJames Luiza Carranco, MD 05/14/2017  11:11 AM

## 2017-05-14 NOTE — Progress Notes (Signed)
Came to see patient.  Unfortunately, she is in nuclear medicine.  I will follow-up when she returns.  Hillis RangeJames Treylin Burtch MD, [facility] 05/14/2017 9:32 AM

## 2017-05-14 NOTE — Progress Notes (Signed)
ANTICOAGULATION CONSULT NOTE - Initial Consult  Pharmacy Consult for heparin Indication: r/o PE  No Known Allergies  Patient Measurements: Weight: 184 lb 8.4 oz (83.7 kg)  Vital Signs: Temp: 98.9 F (37.2 C) (12/28 2359) Temp Source: Oral (12/28 2359) BP: 188/61 (12/28 2359) Pulse Rate: 39 (12/28 2359)  Labs: Recent Labs    05/12/17 2146 05/13/17 0925  HGB 12.0 10.7*  HCT 38.1 34.4*  PLT 180 165  CREATININE 0.91 0.79     Medical History: Past Medical History:  Diagnosis Date  . Hyperlipidemia   . Hypertension     Medications:  Medications Prior to Admission  Medication Sig Dispense Refill Last Dose  . aspirin EC 81 MG tablet Take 81 mg by mouth daily.   05/12/2017 at Unknown time  . atorvastatin (LIPITOR) 20 MG tablet Take 20 mg by mouth daily.   05/12/2017 at Unknown time  . candesartan (ATACAND) 16 MG tablet Take 16 mg by mouth 2 (two) times daily.   05/12/2017 at Unknown time  . felodipine (PLENDIL) 5 MG 24 hr tablet Take 2.5 mg by mouth daily.   05/12/2017 at Unknown time  . metoprolol succinate (TOPROL-XL) 100 MG 24 hr tablet Take 100 mg by mouth daily. Take with or immediately following a meal.   05/12/2017 at 0800   Scheduled:  . aspirin EC  81 mg Oral Daily  . atorvastatin  20 mg Oral q1800  . felodipine  2.5 mg Oral Daily  . irbesartan  150 mg Oral Daily   Infusions:  . sodium chloride 125 mL/hr at 05/13/17 2000    Assessment: 71yo female c/o SOB w/ exertion, admitted w/ bradycardia, concern for PE, awaiting VQ scan, to begin heparin.  Goal of Therapy:  Heparin level 0.3-0.7 units/ml Monitor platelets by anticoagulation protocol: Yes   Plan:  Last got low-dose LMWH 12/28 am; will give heparin 4000 units IV bolus x1 followed by gtt at 1100 units/hr and monitor heparin levels and CBC.  Vernard GamblesVeronda Gennifer Potenza, PharmD, BCPS  05/14/2017,12:13 AM

## 2017-05-14 NOTE — Progress Notes (Signed)
CALLED  EMERGENTLY TO EVALUATE  PT  And  Discussed  With  NP Blount AND Nursign staff of  [facility]  With  Help  Of  Her son , (a  Pharmacist Teacher, early years/pre And  Other sons's MD ) Romie Levee/INTERPRETATION,    Discussed  With PATIENT ABOUT  SYMPTOMS  She  Has  Significant  Dyspnea, exertional , dyspnea, a sense  Of  Doo,  Dizziness  And  Passing  Out  Symptoms,   HR  30-38 SINUS  PAUSE OF  2.8 SEC , TELEMETRY  NOTICED  V/Q  SCAN  TO RULE OUT PE  IS  BEING  DONE  AS WELL HEPARIN GTT  On  Board  PACER  PADS  On chest  ,  Pre emptive dopamine gtt  Recommended  CCM consult  Per  NP  Blount for central  Line.  Due to  Sinus pauses, pt  Symptoms, will also  Discuss with EP  For  PPM   eval

## 2017-05-14 NOTE — Progress Notes (Signed)
PROGRESS NOTE  Brandi Ayers ZOX:[id number] DOB: 06/06/1945 DOA: 05/12/2017 PCP: Patient, No Pcp Per   LOS: 1 day   Brief Narrative / Interim history: 71 yo F with HTN, HLD, visiting from [place], presented to the hospital with shortness of breath and weakness, she was found to be bradycardic and was admitted to stepdown.  After admission for bradycardia got worse, requiring emergent cardiology evaluation overnight and initiation of dopamine infusion.  Assessment & Plan: Active Problems:   Bradycardia   Bradycardia -EP following, discussed with Dr. Johney FrameAllred, likely will need emergent pacemaker placement today -Looks like high-grade block on the telemetry  Elevated d-dimer -VQ scan ordered overnight, less likely PE and symptoms are probably due to #1  Hypertension -Hold metoprolol, allow blood pressure on the high side, on dopamine  Hyperlipidemia -Continue Lipitor   DVT prophylaxis: heparin Code Status: Full code Family Communication: son at bedside  Disposition Plan: remain in SDU  Consultants:   Cardiology - EP  Procedures:   None   Antimicrobials:  None   Subjective: - no chest pain, shortness of breath, no abdominal pain, nausea or vomiting. Feeling better this morning   Objective: Vitals:   05/14/17 0000 05/14/17 0400 05/14/17 0415 05/14/17 0812  BP:  (!) 167/48 (!) 167/48 (!) 153/52  Pulse: (!) 37 (!) 40 (!) 41 63  Resp: (!) 22 18 18  (!) 21  Temp:   99 F (37.2 C) 99.8 F (37.7 C)  TempSrc:   Oral Oral  SpO2: 94% 90% 93% 93%  Weight:      Height:        Intake/Output Summary (Last 24 hours) at 05/14/2017 1107 Last data filed at 05/14/2017 0800 Gross per 24 hour  Intake 2339.26 ml  Output -  Net 2339.26 ml   Filed Weights   05/13/17 0615 05/13/17 1830  Weight: 83.7 kg (184 lb 8.4 oz) 83.7 kg (184 lb 8.4 oz)    Examination:  Constitutional: NAD Eyes: lids and conjunctivae normal ENMT: Mucous membranes are moist.  Respiratory: clear to  auscultation bilaterally, no wheezing, no crackles.  Cardiovascular: Regular rate and rhythm, no murmurs / rubs / gallops. Bradycardic. No Edema  Abdomen: no tenderness. Bowel sounds positive.  Skin: no rashes, lesions, ulcers. No induration Neurologic: non focal   Data Reviewed: I have independently reviewed following labs and imaging studies  CBC: Recent Labs  Lab 05/12/17 2146 05/13/17 0925 05/14/17 0608  WBC 9.0 8.8 11.8*  NEUTROABS 5.0  --   --   HGB 12.0 10.7* 11.5*  HCT 38.1 34.4* 35.9*  MCV 96.7 94.5 95.7  PLT 180 165 157   Basic Metabolic Panel: Recent Labs  Lab 05/12/17 2146 05/13/17 0925 05/14/17 0608  NA 141  --  139  K 3.9  --  3.4*  CL 112*  --  107  CO2 23  --  18*  GLUCOSE 111*  --  113*  BUN 21*  --  9  CREATININE 0.91 0.79 0.73  CALCIUM 9.0  --  8.7*   GFR: Estimated Creatinine Clearance: 66.9 mL/min (by C-G formula based on SCr of 0.73 mg/dL). Liver Function Tests: Recent Labs  Lab 05/12/17 2146 05/14/17 0608  AST 33 21  ALT 28 19  ALKPHOS 72 67  BILITOT 1.0 1.7*  PROT 7.1 6.3*  ALBUMIN 3.8 3.5   No results for input(s): LIPASE, AMYLASE in the last 168 hours. No results for input(s): AMMONIA in the last 168 hours. Coagulation Profile: No results for input(s): INR,  PROTIME in the last 168 hours. Cardiac Enzymes: Recent Labs  Lab 05/14/17 0608  TROPONINI 0.03*   BNP (last 3 results) No results for input(s): PROBNP in the last 8760 hours. HbA1C: No results for input(s): HGBA1C in the last 72 hours. CBG: No results for input(s): GLUCAP in the last 168 hours. Lipid Profile: No results for input(s): CHOL, HDL, LDLCALC, TRIG, CHOLHDL, LDLDIRECT in the last 72 hours. Thyroid Function Tests: Recent Labs    05/13/17 0208  TSH 2.964   Anemia Panel: No results for input(s): VITAMINB12, FOLATE, FERRITIN, TIBC, IRON, RETICCTPCT in the last 72 hours. Urine analysis:    Component Value Date/Time   COLORURINE COLORLESS (A) 05/12/2017  2141   APPEARANCEUR CLEAR 05/12/2017 2141   LABSPEC 1.003 (L) 05/12/2017 2141   PHURINE 6.0 05/12/2017 2141   GLUCOSEU NEGATIVE 05/12/2017 2141   HGBUR SMALL (A) 05/12/2017 2141   BILIRUBINUR NEGATIVE 05/12/2017 2141   KETONESUR NEGATIVE 05/12/2017 2141   PROTEINUR NEGATIVE 05/12/2017 2141   NITRITE NEGATIVE 05/12/2017 2141   LEUKOCYTESUR NEGATIVE 05/12/2017 2141   Sepsis Labs: Invalid input(s): PROCALCITONIN, LACTICIDVEN  Recent Results (from the past 240 hour(s))  MRSA PCR Screening     Status: None   Collection Time: 05/13/17  7:30 AM  Result Value Ref Range Status   MRSA by PCR NEGATIVE NEGATIVE Final    Comment:        The GeneXpert MRSA Assay (FDA approved for NASAL specimens only), is one component of a comprehensive MRSA colonization surveillance program. It is not intended to diagnose MRSA infection nor to guide or monitor treatment for MRSA infections.       Radiology Studies: Dg Chest 2 View  Result Date: 05/12/2017 CLINICAL DATA:  Acute onset of shortness of breath with exertion. Generalized weakness and fatigue. EXAM: CHEST  2 VIEW COMPARISON:  None. FINDINGS: The lungs are well-aerated. Vascular congestion is noted. Increased interstitial markings may reflect mild interstitial edema. A small 1.0 cm nodular opacity is suggested at the left hilum. There is no evidence of pleural effusion or pneumothorax. The heart is borderline normal in size. No acute osseous abnormalities are seen. IMPRESSION: 1. Vascular congestion. Increased interstitial markings may reflect mild interstitial edema. 2. Small 1.0 cm nodular opacity suggested at the left hilum. Followup PA and lateral chest X-ray is recommended in 3-4 weeks following treatment of interstitial edema to ensure resolution and exclude underlying malignancy. Electronically Signed   By: Roanna RaiderJeffery  Chang M.D.   On: 05/12/2017 22:14     Scheduled Meds: . atorvastatin  20 mg Oral q1800  . felodipine  2.5 mg Oral Daily    . irbesartan  150 mg Oral Daily   Continuous Infusions: . sodium chloride 50 mL/hr at 05/14/17 0800  . DOPamine 0 mcg/kg/min (05/14/17 0800)    Pamella Pertostin Gherghe, MD, PhD [facility] Pager [phone]  If 7PM-7AM, please contact night-coverage [email or web address] Password [email or web address] 05/14/2017, 11:07 AM

## 2017-05-14 NOTE — Progress Notes (Addendum)
  Echocardiogram 2D Echocardiogram has been performed.  Patient became agitated when continuous wave probe was used in order to assess aortic valve.   Cinde Ebert L Androw 05/14/2017, 11:11 AM

## 2017-05-14 NOTE — Progress Notes (Signed)
Dopamine increased to 423mcg/kg/min for b/p reading of 136/44 HR 31 while sleeping.

## 2017-05-15 ENCOUNTER — Inpatient Hospital Stay (HOSPITAL_COMMUNITY): Payer: Medicaid Other

## 2017-05-15 ENCOUNTER — Encounter (HOSPITAL_COMMUNITY): Payer: Self-pay

## 2017-05-15 DIAGNOSIS — Z959 Presence of cardiac and vascular implant and graft, unspecified: Secondary | ICD-10-CM

## 2017-05-15 DIAGNOSIS — I442 Atrioventricular block, complete: Secondary | ICD-10-CM

## 2017-05-15 LAB — CBC
HCT: 36.9 % (ref 36.0–46.0)
Hemoglobin: 11.9 g/dL — ABNORMAL LOW (ref 12.0–15.0)
MCH: 30.7 pg (ref 26.0–34.0)
MCHC: 32.2 g/dL (ref 30.0–36.0)
MCV: 95.1 fL (ref 78.0–100.0)
PLATELETS: 158 10*3/uL (ref 150–400)
RBC: 3.88 MIL/uL (ref 3.87–5.11)
RDW: 13.8 % (ref 11.5–15.5)
WBC: 9.5 10*3/uL (ref 4.0–10.5)

## 2017-05-15 LAB — BASIC METABOLIC PANEL
Anion gap: 11 (ref 5–15)
BUN: 9 mg/dL (ref 6–20)
CHLORIDE: 103 mmol/L (ref 101–111)
CO2: 23 mmol/L (ref 22–32)
CREATININE: 0.71 mg/dL (ref 0.44–1.00)
Calcium: 8.8 mg/dL — ABNORMAL LOW (ref 8.9–10.3)
Glucose, Bld: 105 mg/dL — ABNORMAL HIGH (ref 65–99)
Potassium: 3.2 mmol/L — ABNORMAL LOW (ref 3.5–5.1)
SODIUM: 137 mmol/L (ref 135–145)

## 2017-05-15 MED ORDER — METOPROLOL SUCCINATE ER 50 MG PO TB24
50.0000 mg | ORAL_TABLET | Freq: Every day | ORAL | Status: DC
  Administered 2017-05-15: 50 mg via ORAL
  Filled 2017-05-15: qty 1

## 2017-05-15 MED ORDER — POTASSIUM CHLORIDE CRYS ER 20 MEQ PO TBCR
40.0000 meq | EXTENDED_RELEASE_TABLET | Freq: Once | ORAL | Status: AC
  Administered 2017-05-15: 40 meq via ORAL
  Filled 2017-05-15: qty 2

## 2017-05-15 MED ORDER — METOPROLOL SUCCINATE ER 50 MG PO TB24
50.0000 mg | ORAL_TABLET | Freq: Every day | ORAL | 1 refills | Status: DC
Start: 2017-05-15 — End: 2017-07-22

## 2017-05-15 NOTE — Progress Notes (Signed)
Progress Note   Subjective   Doing well today, the patient denies CP or SOB.  Nausea has resolved.  She feels "much better" and is pleased with results.  No new concerns  Inpatient Medications    Scheduled Meds: . atorvastatin  20 mg Oral q1800  . felodipine  2.5 mg Oral Daily  . irbesartan  150 mg Oral Daily  . metoprolol succinate  50 mg Oral Daily  . potassium chloride  40 mEq Oral Once  . sodium chloride flush  3 mL Intravenous Q12H   Continuous Infusions: . sodium chloride     PRN Meds: sodium chloride, acetaminophen, hydrALAZINE, HYDROcodone-acetaminophen, ondansetron (ZOFRAN) IV, sodium chloride flush   Vital Signs    Vitals:   05/15/17 0000 05/15/17 0400 05/15/17 0410 05/15/17 0824  BP: (!) 173/70   (!) 184/80  Pulse: 77 72 72   Resp: (!) 21 (!) 22 20   Temp: (!) 100.9 F (38.3 C)   98 F (36.7 C)  TempSrc: Oral   Oral  SpO2: 95% 95% 97%   Weight:      Height:        Intake/Output Summary (Last 24 hours) at 05/15/2017 0842 Last data filed at 05/15/2017 0325 Gross per 24 hour  Intake 340 ml  Output 975 ml  Net -635 ml   Filed Weights   05/13/17 0615 05/13/17 1830  Weight: 184 lb 8.4 oz (83.7 kg) 184 lb 8.4 oz (83.7 kg)    Telemetry    Sinus with V pacing - Personally Reviewed  Physical Exam   GEN- The patient is well appearing, alert and oriented x 3 today.   Head- normocephalic, atraumatic Eyes-  Sclera clear, conjunctiva pink Ears- hearing intact Oropharynx- clear Neck- supple, Lungs- Clear to ausculation bilaterally, normal work of breathing Heart- Regular rate and rhythm  GI- soft, NT, ND, + BS Extremities- no clubbing, cyanosis, or edema  MS- no significant deformity or atrophy Skin- pacemaker pocket is without hematoma   Labs    Chemistry Recent Labs  Lab 05/12/17 2146 05/13/17 0925 05/14/17 0608 05/15/17 0204  NA 141  --  139 137  K 3.9  --  3.4* 3.2*  CL 112*  --  107 103  CO2 23  --  18* 23  GLUCOSE 111*  --  113*  105*  BUN 21*  --  9 9  CREATININE 0.91 0.79 0.73 0.71  CALCIUM 9.0  --  8.7* 8.8*  PROT 7.1  --  6.3*  --   ALBUMIN 3.8  --  3.5  --   AST 33  --  21  --   ALT 28  --  19  --   ALKPHOS 72  --  67  --   BILITOT 1.0  --  1.7*  --   GFRNONAA >60 >60 >60 >60  GFRAA >60 >60 >60 >60  ANIONGAP 6  --  14 11     Hematology Recent Labs  Lab 05/13/17 0925 05/14/17 0608 05/15/17 0204  WBC 8.8 11.8* 9.5  RBC 3.64* 3.75* 3.88  HGB 10.7* 11.5* 11.9*  HCT 34.4* 35.9* 36.9  MCV 94.5 95.7 95.1  MCH 29.4 30.7 30.7  MCHC 31.1 32.0 32.2  RDW 13.4 13.6 13.8  PLT 165 157 158    Cardiac Enzymes Recent Labs  Lab 05/14/17 0608 05/14/17 1439 05/14/17 1726  TROPONINI 0.03* 0.10* 0.11*    Recent Labs  Lab 05/12/17 2207  TROPIPOC 0.00  Assessment & Plan    1.  Complete heart block Mobitz II AV block has progressed to complete heart block Normal PPM function (device interrogation reviewed personally) CXR reveals stable leads, not ptx  2. Elevated BP Will restart toprol at 50mg  daily Follow-up with me for 10 day wound check and BP management  3. afib New diagnosis 45 minutes was the longest episode Possibly due to acute illness Will not start anticoagulation at this time but will reassess at wound check [email or web address] socre is at least 3  4. Low K Primary team to replete  It has been a real pleasure to participate in the care of this sweet lady.  Routine wound care instructions were give to the patient today via her son from [place].  I will arrange follow-up in 7-10 days for a wound check in my office.  Hillis RangeJames Rozelia Catapano MD, [facility] 05/15/2017 8:42 AM

## 2017-05-15 NOTE — Discharge Instructions (Signed)
° ° °  Supplemental Discharge Instructions for  Pacemaker Patients  Activity No heavy lifting or vigorous activity with your left/right arm for 6 to 8 weeks.  Do not raise your left/right arm above your head for one week.  Gradually raise your affected arm as drawn below.           __  05/17/17                        05/18/17                      05/19/17                     05/20/17  NO DRIVING for 1 week  WOUND CARE - Keep the wound area clean and dry.  Do not get this area wet for one week. No showers for one week; you may shower on   05/21/17  . - The tape/steri-strips on your wound will fall off; do not pull them off.  No bandage is needed on the site.  DO  NOT apply any creams, oils, or ointments to the wound area. - If you notice any drainage or discharge from the wound, any swelling or bruising at the site, or you develop a fever > 101? F after you are discharged home, call the office at once.  Special Instructions - You are still able to use cellular telephones; use the ear opposite the side where you have your pacemaker/defibrillator.  Avoid carrying your cellular phone near your device. - When traveling through airports, show security personnel your identification card to avoid being screened in the metal detectors.  Ask the security personnel to use the hand wand. - Avoid electrical appliances that are in poor condition or are not properly grounded. - Microwave ovens are safe to be near or to operate.

## 2017-05-15 NOTE — Discharge Summary (Signed)
Physician Discharge Summary  Brandi Ayers ZOX:[id number] DOB: 11/13/1945 DOA: 05/12/2017  PCP: Patient, No Pcp Per  Admit date: 05/12/2017 Discharge date: 05/15/2017  Admitted From: home Disposition:  home  Recommendations for Outpatient Follow-up:  1. Follow up with Dr. Johney FrameAllred in office in 10 days  Home Health: none  Equipment/Devices: none   Discharge Condition: stable CODE STATUS: Full code Diet recommendation: regular  HPI: Per Cheron SchaumannSofia Leslie, PA Brandi Ayers is a 71 y.o. female with medical history significant of hypertension and hyperlipidemia.  Pt reports she has been short of breath when she is walking for several days.  Pt reports she feels weak all over.  Pt had not noticed that her heart rate was low.  Pt denies any recent illness.  No cough, no fever or chills.  She has not experienced any chest pain.  No history of syncope.  Pt feels her health is over all good.   Pt reports she has no symptoms at rest.  She is visiting here from [place].  Pt reports she lives here and in [place].  No MD here .  Pt has Md in [place].  Pt is on a long acting metoprolol for her blood pressure control.     Hospital Course: Discharge Diagnoses:  Active Problems:   Bradycardia   Bradycardia -patient was admitted to the hospital with generalized weakness, she was found to be significantly bradycardic.  Her metoprolol was stopped on admission.  During her first night of her bradycardia became profound, she developed second-degree AV block type II requiring dopamine infusion and urgent pacemaker implantation by EP.  Procedure was without any apparent complications, chest x-ray did not show any pneumothorax, her symptoms have completely resolved, I have discussed with Dr. Johney FrameAllred from EP and patient will be discharged home in stable condition with outpatient follow-up in their office.  She was in A. fib for short period of time, her pacemaker will be interrogated in office in 10 days, if her A. fib  recurs she will need to be on anticoagulation, and this will be decided as an outpatient Elevated d-dimer -VQ scan negative for PE Hypertension -continue metoprolol however decrease the dose to 50 daily Hyperlipidemia -Continue Lipitor     Discharge Instructions   Allergies as of 05/15/2017   No Known Allergies     Medication List    TAKE these medications   aspirin EC 81 MG tablet Take 81 mg by mouth daily.   atorvastatin 20 MG tablet Commonly known as:  LIPITOR Take 20 mg by mouth daily.   candesartan 16 MG tablet Commonly known as:  ATACAND Take 16 mg by mouth 2 (two) times daily.   felodipine 5 MG 24 hr tablet Commonly known as:  PLENDIL Take 2.5 mg by mouth daily.   metoprolol succinate 50 MG 24 hr tablet Commonly known as:  TOPROL-XL Take 1 tablet (50 mg total) by mouth daily. Take with or immediately following a meal. What changed:    medication strength  how much to take      Follow-up Information    Hillis RangeAllred, James, MD Follow up on 05/27/2017.   Specialty:  Cardiology Why:  at 12:30PM  Contact information: [address] [place] [address] [phone]           Consultations:  Cardiology -EP  Procedures/Studies:  Pacemaker implantation - 12/29  1. Successful implantation of a Medtronic Azure XT MRI conditional dual-chamber pacemaker for symptomatic mobitz II second degree AV block  2. No early apparent complications.    2D echo  Study Conclusions - Left ventricle: The cavity size was normal. Wall thickness was normal. Systolic function was vigorous. The estimated ejection fraction was in the range of 65% to 70%. Wall motion was normal; there were no regional wall motion abnormalities. Doppler parameters are consistent with abnormal left ventricular relaxation (grade 1 diastolic dysfunction). - Aortic valve: There was mild stenosis. There was mild regurgitation. Valve area (VTI): 2.43 cm^2. Valve area (Vmax): 2.61 cm^2.  Valve area (Vmean): 2.44 cm^2. - Mitral valve: There was moderate regurgitation. - Left atrium: The atrium was moderately dilated.  Dg Chest 2 View  Result Date: 05/15/2017 CLINICAL DATA:  Pacemaker insertion EXAM: CHEST  2 VIEW COMPARISON:  Yesterday FINDINGS: Double lead left subclavian pacemaker has been placed. Tips of the leads project over the right atrium and right ventricle apex. Moderate cardiomegaly. Low volumes. Bibasilar atelectasis. Normal vascularity. Interstitial edema has resolved. No pneumothorax. IMPRESSION: Left subclavian pacemaker placement without pneumothorax Cardiomegaly without decompensation.  Edema resolved. Electronically Signed   By: Jolaine ClickArthur  Hoss M.D.   On: 05/15/2017 08:06   Dg Chest 2 View  Result Date: 05/14/2017 CLINICAL DATA:  Intermediate probability for pulmonary embolus. Positive D-dimer. EXAM: CHEST  2 VIEW COMPARISON:  05/12/2017. FINDINGS: There is cardiac enlargement. Mild diffuse pulmonary edema similar to previous exam. There is no airspace opacities. IMPRESSION: 1. Similar appearance of cardiac enlargement and pulmonary edema. Electronically Signed   By: Signa Kellaylor  Stroud M.D.   On: 05/14/2017 11:22   Dg Chest 2 View  Result Date: 05/12/2017 CLINICAL DATA:  Acute onset of shortness of breath with exertion. Generalized weakness and fatigue. EXAM: CHEST  2 VIEW COMPARISON:  None. FINDINGS: The lungs are well-aerated. Vascular congestion is noted. Increased interstitial markings may reflect mild interstitial edema. A small 1.0 cm nodular opacity is suggested at the left hilum. There is no evidence of pleural effusion or pneumothorax. The heart is borderline normal in size. No acute osseous abnormalities are seen. IMPRESSION: 1. Vascular congestion. Increased interstitial markings may reflect mild interstitial edema. 2. Small 1.0 cm nodular opacity suggested at the left hilum. Followup PA and lateral chest X-ray is recommended in 3-4 weeks following treatment of  interstitial edema to ensure resolution and exclude underlying malignancy. Electronically Signed   By: Roanna RaiderJeffery  Chang M.D.   On: 05/12/2017 22:14   Nm Pulmonary Perf And Vent  Result Date: 05/14/2017 CLINICAL DATA:  Suspect pulmonary embolus.  Elevated D-dimer. EXAM: NUCLEAR MEDICINE VENTILATION - PERFUSION LUNG SCAN TECHNIQUE: Ventilation images were obtained in multiple projections using inhaled aerosol Tc-4860m DTPA. Perfusion images were obtained in multiple projections after intravenous injection of Tc-4360m MAA. RADIOPHARMACEUTICALS:  32.3 mCi Technetium-7160m DTPA aerosol inhalation and 4.31 mCi Technetium-7160m MAA IV COMPARISON:  Chest radiograph 05/14/2017 FINDINGS: Ventilation: No focal ventilation defect. Perfusion: No wedge shaped peripheral perfusion defects to suggest acute pulmonary embolism. IMPRESSION: 1. No evidence for pulmonary embolus. Electronically Signed   By: Signa Kellaylor  Stroud M.D.   On: 05/14/2017 11:26      Subjective: - no chest pain, shortness of breath, no abdominal pain, nausea or vomiting.   Discharge Exam: Vitals:   05/15/17 0410 05/15/17 0824  BP:  (!) 184/80  Pulse: 72   Resp: 20   Temp:  98 F (36.7 C)  SpO2: 97%     General: Pt is alert, awake, not in acute distress Cardiovascular: RRR, S1/S2 +, no rubs, no gallops Respiratory: CTA bilaterally, no wheezing, no rhonchi  The results of significant diagnostics from this hospitalization (including imaging, microbiology, ancillary and laboratory) are listed below for reference.     Microbiology: Recent Results (from the past 240 hour(s))  MRSA PCR Screening     Status: None   Collection Time: 05/13/17  7:30 AM  Result Value Ref Range Status   MRSA by PCR NEGATIVE NEGATIVE Final    Comment:        The GeneXpert MRSA Assay (FDA approved for NASAL specimens only), is one component of a comprehensive MRSA colonization surveillance program. It is not intended to diagnose MRSA infection nor to guide  or monitor treatment for MRSA infections.      Labs: BNP (last 3 results) No results for input(s): BNP in the last 8760 hours. Basic Metabolic Panel: Recent Labs  Lab 05/12/17 2146 05/13/17 0925 05/14/17 0608 05/15/17 0204  NA 141  --  139 137  K 3.9  --  3.4* 3.2*  CL 112*  --  107 103  CO2 23  --  18* 23  GLUCOSE 111*  --  113* 105*  BUN 21*  --  9 9  CREATININE 0.91 0.79 0.73 0.71  CALCIUM 9.0  --  8.7* 8.8*   Liver Function Tests: Recent Labs  Lab 05/12/17 2146 05/14/17 0608  AST 33 21  ALT 28 19  ALKPHOS 72 67  BILITOT 1.0 1.7*  PROT 7.1 6.3*  ALBUMIN 3.8 3.5   No results for input(s): LIPASE, AMYLASE in the last 168 hours. No results for input(s): AMMONIA in the last 168 hours. CBC: Recent Labs  Lab 05/12/17 2146 05/13/17 0925 05/14/17 0608 05/15/17 0204  WBC 9.0 8.8 11.8* 9.5  NEUTROABS 5.0  --   --   --   HGB 12.0 10.7* 11.5* 11.9*  HCT 38.1 34.4* 35.9* 36.9  MCV 96.7 94.5 95.7 95.1  PLT 180 165 157 158   Cardiac Enzymes: Recent Labs  Lab 05/14/17 0608 05/14/17 1439 05/14/17 1726  TROPONINI 0.03* 0.10* 0.11*   BNP: Invalid input(s): POCBNP CBG: No results for input(s): GLUCAP in the last 168 hours. D-Dimer Recent Labs    05/13/17 0405  DDIMER 1.47*   Hgb A1c No results for input(s): HGBA1C in the last 72 hours. Lipid Profile No results for input(s): CHOL, HDL, LDLCALC, TRIG, CHOLHDL, LDLDIRECT in the last 72 hours. Thyroid function studies Recent Labs    05/13/17 0208  TSH 2.964   Anemia work up No results for input(s): VITAMINB12, FOLATE, FERRITIN, TIBC, IRON, RETICCTPCT in the last 72 hours. Urinalysis    Component Value Date/Time   COLORURINE COLORLESS (A) 05/12/2017 2141   APPEARANCEUR CLEAR 05/12/2017 2141   LABSPEC 1.003 (L) 05/12/2017 2141   PHURINE 6.0 05/12/2017 2141   GLUCOSEU NEGATIVE 05/12/2017 2141   HGBUR SMALL (A) 05/12/2017 2141   BILIRUBINUR NEGATIVE 05/12/2017 2141   KETONESUR NEGATIVE 05/12/2017  2141   PROTEINUR NEGATIVE 05/12/2017 2141   NITRITE NEGATIVE 05/12/2017 2141   LEUKOCYTESUR NEGATIVE 05/12/2017 2141   Sepsis Labs Invalid input(s): PROCALCITONIN,  WBC,  LACTICIDVEN   Time coordinating discharge: 20 minutes  SIGNED:  Pamella Pert, MD  [facility] 05/15/2017, 1:33 PM Pager [phone]  If 7PM-7AM, please contact night-coverage [email or web address] Password [email or web address]

## 2017-05-16 ENCOUNTER — Encounter (HOSPITAL_COMMUNITY): Payer: Self-pay | Admitting: Internal Medicine

## 2017-05-16 MED FILL — Cefazolin Sodium-Dextrose IV Solution 2 GM/100ML-4%: INTRAVENOUS | Qty: 100 | Status: AC

## 2017-05-27 ENCOUNTER — Ambulatory Visit (INDEPENDENT_AMBULATORY_CARE_PROVIDER_SITE_OTHER): Payer: Self-pay | Admitting: Internal Medicine

## 2017-05-27 ENCOUNTER — Encounter: Payer: Self-pay | Admitting: Internal Medicine

## 2017-05-27 VITALS — BP 144/76 | HR 81 | Ht 65.0 in | Wt 184.6 lb

## 2017-05-27 DIAGNOSIS — I441 Atrioventricular block, second degree: Secondary | ICD-10-CM

## 2017-05-27 DIAGNOSIS — I480 Paroxysmal atrial fibrillation: Secondary | ICD-10-CM

## 2017-05-27 DIAGNOSIS — Z950 Presence of cardiac pacemaker: Secondary | ICD-10-CM

## 2017-05-27 DIAGNOSIS — E876 Hypokalemia: Secondary | ICD-10-CM

## 2017-05-27 DIAGNOSIS — I10 Essential (primary) hypertension: Secondary | ICD-10-CM

## 2017-05-27 LAB — CUP PACEART INCLINIC DEVICE CHECK
Battery Remaining Longevity: 134 mo
Battery Voltage: 3.2 V
Brady Statistic AP VP Percent: 15.77 %
Brady Statistic AS VS Percent: 0.05 %
Brady Statistic RA Percent Paced: 15.66 %
Brady Statistic RV Percent Paced: 99.95 %
Implantable Lead Implant Date: 20181229
Implantable Lead Implant Date: 20181229
Implantable Lead Location: 753860
Implantable Lead Model: 5076
Implantable Lead Model: 5076
Implantable Pulse Generator Implant Date: 20181229
Lead Channel Impedance Value: 285 Ohm
Lead Channel Impedance Value: 342 Ohm
Lead Channel Impedance Value: 399 Ohm
Lead Channel Impedance Value: 456 Ohm
Lead Channel Pacing Threshold Amplitude: 0.75 V
Lead Channel Pacing Threshold Pulse Width: 0.4 ms
Lead Channel Setting Pacing Amplitude: 3.5 V
Lead Channel Setting Sensing Sensitivity: 1.2 mV
MDC IDC LEAD LOCATION: 753859
MDC IDC MSMT LEADCHNL RA SENSING INTR AMPL: 4.25 mV
MDC IDC MSMT LEADCHNL RV PACING THRESHOLD AMPLITUDE: 1 V
MDC IDC MSMT LEADCHNL RV PACING THRESHOLD PULSEWIDTH: 0.4 ms
MDC IDC MSMT LEADCHNL RV SENSING INTR AMPL: 9.125 mV
MDC IDC SESS DTM: 20190111164640
MDC IDC SET LEADCHNL RV PACING AMPLITUDE: 3.5 V
MDC IDC SET LEADCHNL RV PACING PULSEWIDTH: 0.4 ms
MDC IDC STAT BRADY AP VS PERCENT: 0 %
MDC IDC STAT BRADY AS VP PERCENT: 84.18 %

## 2017-05-27 NOTE — Patient Instructions (Signed)
Medication Instructions:  Your physician recommends that you continue on your current medications as directed. Please refer to the Current Medication list given to you today.   Labwork: Your physician recommends that you return for lab work today: BMP   Testing/Procedures: None ordered   Follow-Up: Your physician recommends that you schedule a follow-up appointment in: 6 weeks with Brandi BalsamAmber Seiler, Brandi Ayers and 3 months with Brandi Ayers   [facility] you [facility] choosing [facility]!!

## 2017-05-27 NOTE — Progress Notes (Signed)
    PCP: Patient, No Pcp Per  Primary EP:  Dr Johney FrameAllred  Brandi Ayers is a 72 y.o. female who presents today for routine electrophysiology followup.  Since her recent pacemaker implant, the patient reports doing very well.  Today, she denies symptoms of palpitations, chest pain, shortness of breath,  lower extremity edema, dizziness, presyncope, or syncope.  The patient is otherwise without complaint today.   Past Medical History:  Diagnosis Date  . Hyperlipidemia   . Hypertension    Past Surgical History:  Procedure Laterality Date  . PACEMAKER IMPLANT N/A 05/14/2017   Procedure: PACEMAKER IMPLANT;  Surgeon: Hillis RangeAllred, Lianna Sitzmann, MD;  Location: [facility] INVASIVE [facility] LAB;  Service: Cardiovascular;  Laterality: N/A;    ROS- all systems are reviewed and negative except as per HPI above  Current Outpatient Medications  Medication Sig Dispense Refill  . aspirin EC 81 MG tablet Take 81 mg by mouth daily.    Brandi Ayers Kitchen. atorvastatin (LIPITOR) 20 MG tablet Take 20 mg by mouth daily.    . candesartan (ATACAND) 16 MG tablet Take 16 mg by mouth 2 (two) times daily.    . felodipine (PLENDIL) 5 MG 24 hr tablet Take 5 mg by mouth daily.     . metoprolol succinate (TOPROL-XL) 50 MG 24 hr tablet Take 1 tablet (50 mg total) by mouth daily. Take with or immediately following a meal. 30 tablet 1   No current facility-administered medications for this visit.     Physical Exam: Vitals:   05/27/17 1234  BP: (!) 144/76  Pulse: 81  SpO2: 97%  Weight: 184 lb 9.6 oz (83.7 kg)  Height: 5\' 5"  (1.651 m)    GEN- The patient is well appearing, alert and oriented x 3 today.   Head- normocephalic, atraumatic Eyes-  Sclera clear, conjunctiva pink Ears- hearing intact Oropharynx- clear Lungs- Clear to ausculation bilaterally, normal work of breathing Chest- pacemaker pocket is healing nicely Heart- Regular rate and rhythm, no murmurs, rubs or gallops, PMI not laterally displaced GI- soft, NT, ND, + BS Extremities- no  clubbing, cyanosis, or edema  Pacemaker interrogation- reviewed in detail today,  See PACEART report    Assessment and Plan:  1. Symptomatic complete heart block Normal pacemaker function See Pace Art report No changes today  2. Hypertension BP is much improved  3. Hypokalemia Repeat bmet today  4. afib No further AF by device interrogation.  Will defer anticoagulation for increase in AF burden.  Return to see EP NP in 4-6 weeks for BP check Return to see me for 91 day device follow-up  Hillis RangeJames Doni Widmer MD, [facility] 05/27/2017 12:54 PM

## 2017-05-28 LAB — BASIC METABOLIC PANEL
BUN / CREAT RATIO: 27 (ref 12–28)
BUN: 17 mg/dL (ref 8–27)
CHLORIDE: 107 mmol/L — AB (ref 96–106)
CO2: 22 mmol/L (ref 20–29)
Calcium: 9.4 mg/dL (ref 8.7–10.3)
Creatinine, Ser: 0.63 mg/dL (ref 0.57–1.00)
GFR calc Af Amer: 102 mL/min/{1.73_m2} (ref >59)
GFR, EST NON AFRICAN AMERICAN: 89 mL/min/{1.73_m2} (ref >59)
Glucose: 148 mg/dL — ABNORMAL HIGH (ref 65–99)
POTASSIUM: 4.3 mmol/L (ref 3.5–5.2)
Sodium: 144 mmol/L (ref 134–144)

## 2017-07-21 NOTE — Progress Notes (Signed)
Electrophysiology Office Note Date: 07/22/2017  ID:  Brandi Sladeajieh Pizano, DOB 09/15/1945, MRN [id number]  PCP: Patient, No Pcp Per Electrophysiologist: Allred  CC: BP follow up  Brandi Ayers is a 72 y.o. female seen today for Dr Johney FrameAllred.  She presents today for routine electrophysiology followup.  Since last being seen in our clinic, the patient reports doing very well.  She denies chest pain, palpitations, dyspnea, PND, orthopnea, nausea, vomiting, dizziness, syncope, edema, weight gain, or early satiety.  Device History: MDT dual chamber PPM implanted 2018 for Mobitz II    Past Medical History:  Diagnosis Date  . Hyperlipidemia   . Hypertension   . Mobitz II    a. s/p MDT dual chamber PPM    Past Surgical History:  Procedure Laterality Date  . PACEMAKER IMPLANT N/A 05/14/2017   a. MDT dual chamber Azure PPM implanted by Dr Johney FrameAllred for Mobitz II     Current Outpatient Medications  Medication Sig Dispense Refill  . aspirin EC 81 MG tablet Take 81 mg by mouth daily.    Marland Kitchen. atorvastatin (LIPITOR) 20 MG tablet Take 20 mg by mouth daily.    . candesartan (ATACAND) 16 MG tablet Take 16 mg by mouth 2 (two) times daily.    . felodipine (PLENDIL) 5 MG 24 hr tablet Take 5 mg by mouth daily.     . metoprolol succinate (TOPROL-XL) 50 MG 24 hr tablet Take 1 tablet (50 mg total) by mouth daily. Take with or immediately following a meal. 30 tablet 1   No current facility-administered medications for this visit.     Allergies:   Patient has no known allergies.   Social History: Social History   Socioeconomic History  . Marital status: Widowed    Spouse name: Not on file  . Number of children: Not on file  . Years of education: Not on file  . Highest education level: Not on file  Social Needs  . Financial resource strain: Not on file  . Food insecurity - worry: Not on file  . Food insecurity - inability: Not on file  . Transportation needs - medical: Not on file  . Transportation  needs - non-medical: Not on file  Occupational History  . Not on file  Tobacco Use  . Smoking status: Never Smoker  . Smokeless tobacco: Never Used  Substance and Sexual Activity  . Alcohol use: No    Frequency: Never  . Drug use: No  . Sexual activity: Not on file  Other Topics Concern  . Not on file  Social History Narrative  . Not on file    Review of Systems: All other systems reviewed and are otherwise negative except as noted above.   Physical Exam: VS:  BP (!) 154/76   Pulse 68   Ht 5\' 2"  (1.575 m)   Wt 189 lb (85.7 kg)   BMI 34.57 kg/m  , BMI Body mass index is 34.57 kg/m.  GEN- The patient is elderly appearing, alert and oriented x 3 today.   HEENT: normocephalic, atraumatic; sclera clear, conjunctiva pink; hearing intact; oropharynx clear; neck supple  Lungs- Clear to ausculation bilaterally, normal work of breathing.  No wheezes, rales, rhonchi Heart- Regular rate and rhythm  GI- soft, non-tender, non-distended, bowel sounds present  Extremities- no clubbing, cyanosis, or edema  MS- no significant deformity or atrophy Skin- warm and dry, no rash or lesion; PPM pocket well healed Psych- euthymic mood, full affect Neuro- strength and sensation are intact  PPM Interrogation- reviewed in detail today,  See PACEART report  EKG:  EKG is not ordered today.  Recent Labs: 05/13/2017: TSH 2.964 05/14/2017: ALT 19 05/15/2017: Hemoglobin 11.9; Platelets 158 05/27/2017: BUN 17; Creatinine, Ser 0.63; Potassium 4.3; Sodium 144   Wt Readings from Last 3 Encounters:  07/22/17 189 lb (85.7 kg)  05/27/17 184 lb 9.6 oz (83.7 kg)  05/13/17 184 lb 8.4 oz (83.7 kg)     Other studies Reviewed: Additional studies/ records that were reviewed today include: Dr Jenel Lucks office notes  Assessment and Plan:  1.  Complete heart block  Normal PPM function See Pace Art report No changes today  2.  HTN Stable No change required today BP at home averaging 130's systolic    3.  Paroxysmal atrial fibrillation 1 episode since last check, duration 49 minutes.  We discussed today.  Will follow and consider OAC if burden increases    Current medicines are reviewed at length with the patient today.   The patient does not have concerns regarding her medicines.  The following changes were made today:  none  Labs/ tests ordered today include: none Orders Placed This Encounter  Procedures  . CUP PACEART INCLINIC DEVICE CHECK     Disposition:   Follow up with Dr Johney Frame as scheduled    Signed, Gypsy Balsam, NP 07/22/2017 12:19 PM  [facility] [address] [place] [address] [phone] (office) [phone] (fax)

## 2017-07-22 ENCOUNTER — Ambulatory Visit (INDEPENDENT_AMBULATORY_CARE_PROVIDER_SITE_OTHER): Payer: Medicaid Other | Admitting: Nurse Practitioner

## 2017-07-22 ENCOUNTER — Encounter: Payer: Self-pay | Admitting: Nurse Practitioner

## 2017-07-22 VITALS — BP 154/76 | HR 68 | Ht 62.0 in | Wt 189.0 lb

## 2017-07-22 DIAGNOSIS — I10 Essential (primary) hypertension: Secondary | ICD-10-CM

## 2017-07-22 DIAGNOSIS — I442 Atrioventricular block, complete: Secondary | ICD-10-CM | POA: Diagnosis not present

## 2017-07-22 DIAGNOSIS — I480 Paroxysmal atrial fibrillation: Secondary | ICD-10-CM

## 2017-07-22 LAB — CUP PACEART INCLINIC DEVICE CHECK
Date Time Interrogation Session: 20190308120016
Implantable Lead Implant Date: 20181229
Implantable Lead Location: 753859
Implantable Lead Model: 5076
Implantable Pulse Generator Implant Date: 20181229
MDC IDC LEAD IMPLANT DT: 20181229
MDC IDC LEAD LOCATION: 753860

## 2017-07-22 MED ORDER — FELODIPINE ER 5 MG PO TB24: 5.0000 mg | ORAL_TABLET | Freq: Every day | ORAL | 3 refills | Status: DC

## 2017-07-22 MED ORDER — ATORVASTATIN CALCIUM 20 MG PO TABS: 20.0000 mg | ORAL_TABLET | Freq: Every day | ORAL | 3 refills | Status: AC

## 2017-07-22 MED ORDER — METOPROLOL SUCCINATE ER 50 MG PO TB24: 50.0000 mg | ORAL_TABLET | Freq: Every day | ORAL | 3 refills | Status: AC

## 2017-07-22 MED ORDER — CANDESARTAN CILEXETIL 16 MG PO TABS: 16.0000 mg | ORAL_TABLET | Freq: Two times a day (BID) | ORAL | 3 refills | Status: AC

## 2017-07-22 NOTE — Patient Instructions (Signed)
Medication Instructions:   Your physician recommends that you continue on your current medications as directed. Please refer to the Current Medication list given to you today.   If you need a refill on your cardiac medications before your next appointment, please call your pharmacy.  Labwork:  NONE ORDERED  TODAY    Testing/Procedures: NONE ORDERED  TODAY    Follow-Up:  AS SCHEDULED WITH ALLRED    Any Other Special Instructions Will Be Listed Below (If Applicable).

## 2017-07-29 ENCOUNTER — Telehealth: Payer: Self-pay

## 2017-07-29 NOTE — Telephone Encounter (Addendum)
We received a PA request for Candesartan from covermymeds. I called the pt and to ask if he ever took Losartan or Valsartan (on preferred list-must have tried and failed) and he stated that he has and that they were prescribed by another MD prior to being seen in this office.  I have done a Candesartan PA over the phone with [facility].  Per Cala BradfordKimberly this PA is approved from today until 07/24/2018. PA# [id number] Reference# [id number]   I have notified [facility] of approval.

## 2017-08-22 ENCOUNTER — Encounter: Payer: Self-pay | Admitting: Nurse Practitioner

## 2017-08-26 ENCOUNTER — Encounter: Payer: Self-pay | Admitting: *Deleted

## 2017-08-26 NOTE — Progress Notes (Signed)
Transmission received 08/26/17. New AF noted[place] Message sent to Dr. Johney FrameAllred.  Hillis RangeAllred, James, MD  Matei Magnone, Willa RoughEmma M, RN; Marily LenteSeiler, Amber K, NP        I will continue to follow afib burden. Consider anticoagulation if it increases.   Previous Messages    ----- Message -----  From: Bethanie DickerLong, Elizabeth Haff M, RN  Sent: 08/26/2017  3:08 PM  To: Hillis RangeJames Allred, MD, Marily LenteAmber K Seiler, NP   0.5% AT/AF- new. No OAC. She is currently in [place]- not sure how to take care of this. Her son has been communicating through MyChart to see if her monitor is communicating

## 2017-08-29 ENCOUNTER — Encounter: Payer: Self-pay | Admitting: Internal Medicine

## 2017-09-14 ENCOUNTER — Telehealth: Payer: Self-pay

## 2017-09-14 NOTE — Telephone Encounter (Signed)
I have done a Felodipine PA over the phone with Brandi Ayers at [facility] ([phone]). Per Brandi Ayers we can call back in 24 hours for decision on this PA. Review# [id number]

## 2017-09-16 NOTE — Telephone Encounter (Signed)
I called [facility] and s/w Drew who informed me that the PA for Felodipine I did over the phone with Brandi Ayers on 5/1 did not generate correctly on their end so I have done another one today. Authorization# [id number] Interaction# [id number] Phone: [phone]  Per Kenard Gower I should call them back on Monday 5/6 to get determination on this request.

## 2017-09-19 NOTE — Telephone Encounter (Signed)
**Note De-Identified [name] Obfuscation** I called [facility] and s/w Tina. Per Inetta Fermo this Felodipine PA has been denied. Reason: The pt must try and fail 2 preferred medications prior to requesting a PA on Felodipine.  Preferred medications per Inetta Fermo are: Amlodipine Nifedipine Afeditab CR tab  Will forward message to Gypsy Balsam, NP and her nurse for advisement.

## 2017-09-24 NOTE — Telephone Encounter (Signed)
I haven been out of the office and out of office notification was on - not sure why this wasn't addressed with someone else last week.  Can try Amlodipine  daily.  Track BP at home.   Gypsy Balsam, NP 09/24/2017 12:38 PM

## 2017-09-26 MED ORDER — AMLODIPINE BESYLATE 5 MG PO TABS: 5.0000 mg | ORAL_TABLET | Freq: Every day | ORAL | 3 refills | Status: AC

## 2017-09-26 NOTE — Telephone Encounter (Signed)
Spoke to son (ok per pt).  Informed that pt should start Amlodipine 5 mg once daily.  He confirms pt has not started Felodipine. Advised to monitor BP and call if SBP remains 140s or above. He verbalized understanding and agreeable to plan.

## 2017-10-16 ENCOUNTER — Encounter: Payer: Self-pay | Admitting: Nurse Practitioner

## 2017-10-31 ENCOUNTER — Ambulatory Visit (INDEPENDENT_AMBULATORY_CARE_PROVIDER_SITE_OTHER): Payer: Medicaid Other | Admitting: *Deleted

## 2017-10-31 ENCOUNTER — Telehealth: Payer: Self-pay | Admitting: Cardiology

## 2017-10-31 DIAGNOSIS — I442 Atrioventricular block, complete: Secondary | ICD-10-CM | POA: Diagnosis not present

## 2017-10-31 NOTE — Telephone Encounter (Signed)
Spoke with pt and reminded pt of remote transmission that is due today. Pt verbalized understanding.   

## 2017-11-01 ENCOUNTER — Encounter: Payer: Self-pay | Admitting: Cardiology

## 2017-11-01 LAB — CUP PACEART REMOTE DEVICE CHECK
Battery Remaining Longevity: 137 mo
Battery Voltage: 3.1 V
Brady Statistic AP VS Percent: 0 %
Brady Statistic AS VS Percent: 0.03 %
Brady Statistic RA Percent Paced: 54.42 %
Brady Statistic RV Percent Paced: 99.97 %
Implantable Lead Implant Date: 20181229
Implantable Lead Location: 753859
Implantable Lead Model: 5076
Lead Channel Impedance Value: 304 Ohm
Lead Channel Impedance Value: 399 Ohm
Lead Channel Impedance Value: 608 Ohm
Lead Channel Pacing Threshold Amplitude: 0.625 V
Lead Channel Pacing Threshold Amplitude: 1 V
Lead Channel Pacing Threshold Pulse Width: 0.4 ms
Lead Channel Pacing Threshold Pulse Width: 0.4 ms
Lead Channel Sensing Intrinsic Amplitude: 3 mV
Lead Channel Sensing Intrinsic Amplitude: 3 mV
Lead Channel Sensing Intrinsic Amplitude: 9.625 mV
Lead Channel Setting Pacing Amplitude: 2.5 V
MDC IDC LEAD IMPLANT DT: 20181229
MDC IDC LEAD LOCATION: 753860
MDC IDC MSMT LEADCHNL RV IMPEDANCE VALUE: 380 Ohm
MDC IDC MSMT LEADCHNL RV SENSING INTR AMPL: 9.625 mV
MDC IDC PG IMPLANT DT: 20181229
MDC IDC SESS DTM: 20190617202834
MDC IDC SET LEADCHNL RA PACING AMPLITUDE: 2 V
MDC IDC SET LEADCHNL RV PACING PULSEWIDTH: 0.4 ms
MDC IDC SET LEADCHNL RV SENSING SENSITIVITY: 1.2 mV
MDC IDC STAT BRADY AP VP PERCENT: 54.49 %
MDC IDC STAT BRADY AS VP PERCENT: 45.48 %

## 2017-11-01 NOTE — Progress Notes (Signed)
Remote pacemaker transmission.   

## 2018-01-30 ENCOUNTER — Telehealth: Payer: Self-pay | Admitting: Cardiology

## 2018-01-30 ENCOUNTER — Ambulatory Visit: Payer: Medicaid Other | Admitting: *Deleted

## 2018-01-30 NOTE — Telephone Encounter (Signed)
Spoke with pt and reminded pt of remote transmission that is due today. Pt verbalized understanding.   

## 2018-01-31 ENCOUNTER — Encounter: Payer: Self-pay | Admitting: Cardiology

## 2018-01-31 ENCOUNTER — Ambulatory Visit (INDEPENDENT_AMBULATORY_CARE_PROVIDER_SITE_OTHER): Payer: Medicaid Other | Admitting: *Deleted

## 2018-01-31 DIAGNOSIS — I442 Atrioventricular block, complete: Secondary | ICD-10-CM | POA: Diagnosis not present

## 2018-02-01 NOTE — Progress Notes (Signed)
Remote pacemaker transmission.   

## 2018-02-24 LAB — CUP PACEART REMOTE DEVICE CHECK
Battery Voltage: 3.03 V
Brady Statistic AP VS Percent: 0 %
Brady Statistic AS VP Percent: 56.64 %
Brady Statistic AS VS Percent: 0.05 %
Brady Statistic RV Percent Paced: 99.94 %
Date Time Interrogation Session: 20190917172105
Implantable Lead Implant Date: 20181229
Implantable Lead Location: 753860
Implantable Lead Model: 5076
Implantable Lead Model: 5076
Implantable Pulse Generator Implant Date: 20181229
Lead Channel Impedance Value: 437 Ohm
Lead Channel Impedance Value: 494 Ohm
Lead Channel Impedance Value: 646 Ohm
Lead Channel Pacing Threshold Amplitude: 0.625 V
Lead Channel Sensing Intrinsic Amplitude: 9.625 mV
Lead Channel Sensing Intrinsic Amplitude: 9.625 mV
Lead Channel Setting Pacing Amplitude: 2 V
Lead Channel Setting Pacing Amplitude: 2.5 V
Lead Channel Setting Pacing Pulse Width: 0.4 ms
MDC IDC LEAD IMPLANT DT: 20181229
MDC IDC LEAD LOCATION: 753859
MDC IDC MSMT BATTERY REMAINING LONGEVITY: 135 mo
MDC IDC MSMT LEADCHNL RA IMPEDANCE VALUE: 342 Ohm
MDC IDC MSMT LEADCHNL RA PACING THRESHOLD PULSEWIDTH: 0.4 ms
MDC IDC MSMT LEADCHNL RA SENSING INTR AMPL: 3.5 mV
MDC IDC MSMT LEADCHNL RA SENSING INTR AMPL: 3.5 mV
MDC IDC MSMT LEADCHNL RV PACING THRESHOLD AMPLITUDE: 0.75 V
MDC IDC MSMT LEADCHNL RV PACING THRESHOLD PULSEWIDTH: 0.4 ms
MDC IDC SET LEADCHNL RV SENSING SENSITIVITY: 1.2 mV
MDC IDC STAT BRADY AP VP PERCENT: 43.3 %
MDC IDC STAT BRADY RA PERCENT PACED: 43.23 %

## 2018-05-02 ENCOUNTER — Telehealth: Payer: Self-pay

## 2018-05-02 NOTE — Telephone Encounter (Signed)
Attempted to confirm remote transmission with pt. No answer and was unable to leave a message.   

## 2018-05-03 ENCOUNTER — Encounter: Payer: Self-pay | Admitting: Cardiology

## 2018-05-08 ENCOUNTER — Ambulatory Visit (INDEPENDENT_AMBULATORY_CARE_PROVIDER_SITE_OTHER): Payer: Medicaid Other

## 2018-05-08 DIAGNOSIS — I442 Atrioventricular block, complete: Secondary | ICD-10-CM

## 2018-05-08 DIAGNOSIS — I441 Atrioventricular block, second degree: Secondary | ICD-10-CM

## 2018-05-09 LAB — CUP PACEART REMOTE DEVICE CHECK
Brady Statistic AP VP Percent: 44.3 %
Brady Statistic AP VS Percent: 0 %
Brady Statistic AS VS Percent: 0.04 %
Brady Statistic RV Percent Paced: 99.96 %
Implantable Lead Implant Date: 20181229
Implantable Lead Location: 753859
Lead Channel Impedance Value: 437 Ohm
Lead Channel Impedance Value: 437 Ohm
Lead Channel Impedance Value: 513 Ohm
Lead Channel Pacing Threshold Amplitude: 0.625 V
Lead Channel Sensing Intrinsic Amplitude: 3.125 mV
Lead Channel Sensing Intrinsic Amplitude: 9.625 mV
Lead Channel Sensing Intrinsic Amplitude: 9.625 mV
Lead Channel Setting Pacing Amplitude: 2 V
Lead Channel Setting Pacing Amplitude: 2.5 V
Lead Channel Setting Pacing Pulse Width: 0.4 ms
Lead Channel Setting Sensing Sensitivity: 1.2 mV
MDC IDC LEAD IMPLANT DT: 20181229
MDC IDC LEAD LOCATION: 753860
MDC IDC MSMT BATTERY REMAINING LONGEVITY: 126 mo
MDC IDC MSMT BATTERY VOLTAGE: 3.01 V
MDC IDC MSMT LEADCHNL RA IMPEDANCE VALUE: 323 Ohm
MDC IDC MSMT LEADCHNL RA PACING THRESHOLD AMPLITUDE: 0.625 V
MDC IDC MSMT LEADCHNL RA PACING THRESHOLD PULSEWIDTH: 0.4 ms
MDC IDC MSMT LEADCHNL RA SENSING INTR AMPL: 3.125 mV
MDC IDC MSMT LEADCHNL RV PACING THRESHOLD PULSEWIDTH: 0.4 ms
MDC IDC PG IMPLANT DT: 20181229
MDC IDC SESS DTM: 20191221095017
MDC IDC STAT BRADY AS VP PERCENT: 55.66 %
MDC IDC STAT BRADY RA PERCENT PACED: 44.26 %

## 2018-05-09 NOTE — Progress Notes (Signed)
Remote pacemaker transmission.   

## 2018-08-07 ENCOUNTER — Other Ambulatory Visit: Payer: Self-pay

## 2018-08-07 ENCOUNTER — Ambulatory Visit (INDEPENDENT_AMBULATORY_CARE_PROVIDER_SITE_OTHER): Payer: Self-pay | Admitting: *Deleted

## 2018-08-07 DIAGNOSIS — I442 Atrioventricular block, complete: Secondary | ICD-10-CM

## 2018-08-08 LAB — CUP PACEART REMOTE DEVICE CHECK
Brady Statistic AP VP Percent: 46.44 %
Brady Statistic AP VS Percent: 0 %
Brady Statistic AS VP Percent: 53.55 %
Brady Statistic RV Percent Paced: 99.98 %
Date Time Interrogation Session: 20200321152440
Implantable Lead Location: 753859
Implantable Lead Model: 5076
Implantable Lead Model: 5076
Lead Channel Impedance Value: 475 Ohm
Lead Channel Sensing Intrinsic Amplitude: 3.375 mV
Lead Channel Sensing Intrinsic Amplitude: 9.625 mV
Lead Channel Sensing Intrinsic Amplitude: 9.625 mV
Lead Channel Setting Pacing Amplitude: 2 V
Lead Channel Setting Pacing Amplitude: 2.5 V
Lead Channel Setting Pacing Pulse Width: 0.4 ms
MDC IDC LEAD IMPLANT DT: 20181229
MDC IDC LEAD IMPLANT DT: 20181229
MDC IDC LEAD LOCATION: 753860
MDC IDC MSMT BATTERY REMAINING LONGEVITY: 124 mo
MDC IDC MSMT BATTERY VOLTAGE: 2.99 V
MDC IDC MSMT LEADCHNL RA IMPEDANCE VALUE: 323 Ohm
MDC IDC MSMT LEADCHNL RA IMPEDANCE VALUE: 399 Ohm
MDC IDC MSMT LEADCHNL RA PACING THRESHOLD AMPLITUDE: 0.625 V
MDC IDC MSMT LEADCHNL RA PACING THRESHOLD PULSEWIDTH: 0.4 ms
MDC IDC MSMT LEADCHNL RA SENSING INTR AMPL: 3.375 mV
MDC IDC MSMT LEADCHNL RV IMPEDANCE VALUE: 551 Ohm
MDC IDC MSMT LEADCHNL RV PACING THRESHOLD AMPLITUDE: 0.625 V
MDC IDC MSMT LEADCHNL RV PACING THRESHOLD PULSEWIDTH: 0.4 ms
MDC IDC PG IMPLANT DT: 20181229
MDC IDC SET LEADCHNL RV SENSING SENSITIVITY: 1.2 mV
MDC IDC STAT BRADY AS VS PERCENT: 0.02 %
MDC IDC STAT BRADY RA PERCENT PACED: 46.39 %

## 2018-08-15 NOTE — Progress Notes (Signed)
Remote pacemaker transmission.   

## 2018-11-06 ENCOUNTER — Ambulatory Visit (INDEPENDENT_AMBULATORY_CARE_PROVIDER_SITE_OTHER): Payer: Self-pay | Admitting: *Deleted

## 2018-11-06 DIAGNOSIS — R001 Bradycardia, unspecified: Secondary | ICD-10-CM

## 2018-11-06 DIAGNOSIS — I442 Atrioventricular block, complete: Secondary | ICD-10-CM

## 2018-11-06 LAB — CUP PACEART REMOTE DEVICE CHECK
Battery Remaining Longevity: 122 mo
Battery Voltage: 2.97 V
Brady Statistic AP VP Percent: 45.7 %
Brady Statistic AP VS Percent: 0.01 %
Brady Statistic AS VP Percent: 54.24 %
Brady Statistic AS VS Percent: 0.05 %
Brady Statistic RA Percent Paced: 45.62 %
Brady Statistic RV Percent Paced: 99.88 %
Date Time Interrogation Session: 20200622130635
Implantable Lead Implant Date: 20181229
Implantable Lead Implant Date: 20181229
Implantable Lead Location: 753859
Implantable Lead Location: 753860
Implantable Lead Model: 5076
Implantable Lead Model: 5076
Implantable Pulse Generator Implant Date: 20181229
Lead Channel Impedance Value: 342 Ohm
Lead Channel Impedance Value: 418 Ohm
Lead Channel Impedance Value: 456 Ohm
Lead Channel Impedance Value: 570 Ohm
Lead Channel Pacing Threshold Amplitude: 0.625 V
Lead Channel Pacing Threshold Amplitude: 0.625 V
Lead Channel Pacing Threshold Pulse Width: 0.4 ms
Lead Channel Pacing Threshold Pulse Width: 0.4 ms
Lead Channel Sensing Intrinsic Amplitude: 10.5 mV
Lead Channel Sensing Intrinsic Amplitude: 10.5 mV
Lead Channel Sensing Intrinsic Amplitude: 3.25 mV
Lead Channel Sensing Intrinsic Amplitude: 3.25 mV
Lead Channel Setting Pacing Amplitude: 2 V
Lead Channel Setting Pacing Amplitude: 2.5 V
Lead Channel Setting Pacing Pulse Width: 0.4 ms
Lead Channel Setting Sensing Sensitivity: 1.2 mV

## 2018-11-14 NOTE — Progress Notes (Signed)
Remote pacemaker transmission.   

## 2019-02-06 ENCOUNTER — Ambulatory Visit (INDEPENDENT_AMBULATORY_CARE_PROVIDER_SITE_OTHER): Payer: Self-pay | Admitting: *Deleted

## 2019-02-06 DIAGNOSIS — R001 Bradycardia, unspecified: Secondary | ICD-10-CM

## 2019-02-06 DIAGNOSIS — I442 Atrioventricular block, complete: Secondary | ICD-10-CM

## 2019-02-07 LAB — CUP PACEART REMOTE DEVICE CHECK
Battery Remaining Longevity: 116 mo
Battery Voltage: 2.96 V
Brady Statistic AP VP Percent: 52.56 %
Brady Statistic AP VS Percent: 0 %
Brady Statistic AS VP Percent: 47.44 %
Brady Statistic AS VS Percent: 0 %
Brady Statistic RA Percent Paced: 52.49 %
Brady Statistic RV Percent Paced: 100 %
Date Time Interrogation Session: 20200923101908
Implantable Lead Implant Date: 20181229
Implantable Lead Implant Date: 20181229
Implantable Lead Location: 753859
Implantable Lead Location: 753860
Implantable Lead Model: 5076
Implantable Lead Model: 5076
Implantable Pulse Generator Implant Date: 20181229
Lead Channel Impedance Value: 342 Ohm
Lead Channel Impedance Value: 437 Ohm
Lead Channel Impedance Value: 437 Ohm
Lead Channel Impedance Value: 513 Ohm
Lead Channel Pacing Threshold Amplitude: 0.5 V
Lead Channel Pacing Threshold Amplitude: 0.625 V
Lead Channel Pacing Threshold Pulse Width: 0.4 ms
Lead Channel Pacing Threshold Pulse Width: 0.4 ms
Lead Channel Sensing Intrinsic Amplitude: 10.5 mV
Lead Channel Sensing Intrinsic Amplitude: 10.5 mV
Lead Channel Sensing Intrinsic Amplitude: 5.5 mV
Lead Channel Sensing Intrinsic Amplitude: 5.5 mV
Lead Channel Setting Pacing Amplitude: 2 V
Lead Channel Setting Pacing Amplitude: 2.5 V
Lead Channel Setting Pacing Pulse Width: 0.4 ms
Lead Channel Setting Sensing Sensitivity: 1.2 mV

## 2019-02-15 NOTE — Progress Notes (Signed)
Remote pacemaker transmission.   

## 2019-05-08 ENCOUNTER — Ambulatory Visit (INDEPENDENT_AMBULATORY_CARE_PROVIDER_SITE_OTHER): Payer: Self-pay | Admitting: *Deleted

## 2019-05-08 DIAGNOSIS — R001 Bradycardia, unspecified: Secondary | ICD-10-CM

## 2019-05-08 LAB — CUP PACEART REMOTE DEVICE CHECK
Battery Remaining Longevity: 122 mo
Battery Voltage: 2.96 V
Brady Statistic AP VP Percent: 44.77 %
Brady Statistic AP VS Percent: 0 %
Brady Statistic AS VP Percent: 55.21 %
Brady Statistic AS VS Percent: 0.02 %
Brady Statistic RA Percent Paced: 44.71 %
Brady Statistic RV Percent Paced: 99.98 %
Date Time Interrogation Session: 20201222155152
Implantable Lead Implant Date: 20181229
Implantable Lead Implant Date: 20181229
Implantable Lead Location: 753859
Implantable Lead Location: 753860
Implantable Lead Model: 5076
Implantable Lead Model: 5076
Implantable Pulse Generator Implant Date: 20181229
Lead Channel Impedance Value: 304 Ohm
Lead Channel Impedance Value: 361 Ohm
Lead Channel Impedance Value: 399 Ohm
Lead Channel Impedance Value: 456 Ohm
Lead Channel Pacing Threshold Amplitude: 0.5 V
Lead Channel Pacing Threshold Amplitude: 0.625 V
Lead Channel Pacing Threshold Pulse Width: 0.4 ms
Lead Channel Pacing Threshold Pulse Width: 0.4 ms
Lead Channel Sensing Intrinsic Amplitude: 11.875 mV
Lead Channel Sensing Intrinsic Amplitude: 11.875 mV
Lead Channel Sensing Intrinsic Amplitude: 3.25 mV
Lead Channel Sensing Intrinsic Amplitude: 3.25 mV
Lead Channel Setting Pacing Amplitude: 2 V
Lead Channel Setting Pacing Amplitude: 2 V
Lead Channel Setting Pacing Pulse Width: 0.4 ms
Lead Channel Setting Sensing Sensitivity: 1.2 mV

## 2019-05-17 ENCOUNTER — Telehealth: Payer: Self-pay

## 2019-05-17 NOTE — Telephone Encounter (Signed)
I spoke with the pt daughter to let her know all she has to do is sleep by the monitor and it will automatically transmit.

## 2019-08-07 ENCOUNTER — Ambulatory Visit (INDEPENDENT_AMBULATORY_CARE_PROVIDER_SITE_OTHER): Payer: Self-pay | Admitting: *Deleted

## 2019-08-07 DIAGNOSIS — R001 Bradycardia, unspecified: Secondary | ICD-10-CM

## 2019-08-07 LAB — CUP PACEART REMOTE DEVICE CHECK
Battery Remaining Longevity: 122 mo
Battery Voltage: 2.95 V
Brady Statistic AP VP Percent: 43.18 %
Brady Statistic AP VS Percent: 0 %
Brady Statistic AS VP Percent: 56.81 %
Brady Statistic AS VS Percent: 0 %
Brady Statistic RA Percent Paced: 43.11 %
Brady Statistic RV Percent Paced: 99.99 %
Date Time Interrogation Session: 20210323140720
Implantable Lead Implant Date: 20181229
Implantable Lead Implant Date: 20181229
Implantable Lead Location: 753859
Implantable Lead Location: 753860
Implantable Lead Model: 5076
Implantable Lead Model: 5076
Implantable Pulse Generator Implant Date: 20181229
Lead Channel Impedance Value: 304 Ohm
Lead Channel Impedance Value: 418 Ohm
Lead Channel Impedance Value: 437 Ohm
Lead Channel Impedance Value: 513 Ohm
Lead Channel Pacing Threshold Amplitude: 0.625 V
Lead Channel Pacing Threshold Amplitude: 0.625 V
Lead Channel Pacing Threshold Pulse Width: 0.4 ms
Lead Channel Pacing Threshold Pulse Width: 0.4 ms
Lead Channel Sensing Intrinsic Amplitude: 11.875 mV
Lead Channel Sensing Intrinsic Amplitude: 11.875 mV
Lead Channel Sensing Intrinsic Amplitude: 3 mV
Lead Channel Sensing Intrinsic Amplitude: 3 mV
Lead Channel Setting Pacing Amplitude: 2 V
Lead Channel Setting Pacing Amplitude: 2 V
Lead Channel Setting Pacing Pulse Width: 0.4 ms
Lead Channel Setting Sensing Sensitivity: 1.2 mV

## 2019-08-08 NOTE — Progress Notes (Signed)
PPM Remote  

## 2019-09-27 IMAGING — NM NM PULMONARY VENT & PERF
16 series · 16 of 16 positions shown · non-contrast
Comparison: Chest radiograph 05/14/2017

CLINICAL DATA: Suspect pulmonary embolus.  Elevated D-dimer.

EXAM:
NUCLEAR MEDICINE VENTILATION - PERFUSION LUNG SCAN
TECHNIQUE: Ventilation images were obtained in multiple projections using
inhaled aerosol Gc-GGm DTPA. Perfusion images were obtained in
multiple projections after intravenous injection of Gc-GGm MAA.
RADIOPHARMACEUTICALS:  32.3 mCi 6echnetium-44m DTPA aerosol
inhalation and 4.31 mCi 6echnetium-44m MAA IV

[Series 1: ant/post vent · 4.14mm/px · 1 of 1 slices shown (1 of 2)]
[im 1/1]
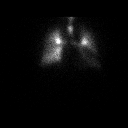

[Series 1: ant/post vent · 4.14mm/px · 1 of 1 slices shown (2 of 2)]
[im 1/1]
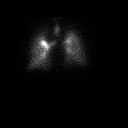

[Series 2: lao/rpo vent · 4.14mm/px · 1 of 1 slices shown (1 of 2)]
[im 1/1]
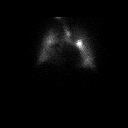

[Series 2: lao/rpo vent · 4.14mm/px · 1 of 1 slices shown (2 of 2)]
[im 1/1]
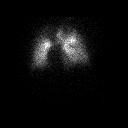

[Series 3: lpo/rao vent · 4.14mm/px · 1 of 1 slices shown (1 of 2)]
[im 1/1]
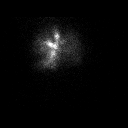

[Series 3: lpo/rao vent · 4.14mm/px · 1 of 1 slices shown (2 of 2)]
[im 1/1]
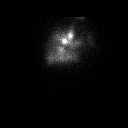

[Series 4: lt lat/rt lat vent · 4.14mm/px · 1 of 1 slices shown (1 of 2)]
[im 1/1]
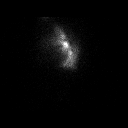

[Series 4: lt lat/rt lat vent · 4.14mm/px · 1 of 1 slices shown (2 of 2)]
[im 1/1]
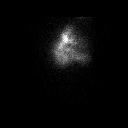

[Series 5: lt lat/rt lat perf · 4.14mm/px · 1 of 1 slices shown (1 of 2)]
[im 1/1]
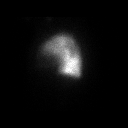

[Series 5: lt lat/rt lat perf · 4.14mm/px · 1 of 1 slices shown (2 of 2)]
[im 1/1]
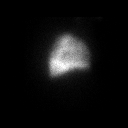

[Series 6: lpo/rao perf · 4.14mm/px · 1 of 1 slices shown (1 of 2)]
[im 1/1]
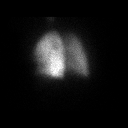

[Series 6: lpo/rao perf · 4.14mm/px · 1 of 1 slices shown (2 of 2)]
[im 1/1]
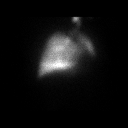

[Series 7: ant/post perf · 4.14mm/px · 1 of 1 slices shown (1 of 2)]
[im 1/1]
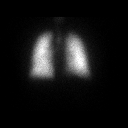

[Series 7: ant/post perf · 4.14mm/px · 1 of 1 slices shown (2 of 2)]
[im 1/1]
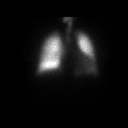

[Series 8: lao/rpo perf · 4.14mm/px · 1 of 1 slices shown (1 of 2)]
[im 1/1]
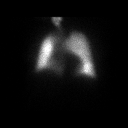

[Series 8: lao/rpo perf · 4.14mm/px · 1 of 1 slices shown (2 of 2)]
[im 1/1]
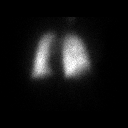

[16 of 16 positions shown; findings below may reference images not displayed]

FINDINGS: Ventilation: No focal ventilation defect.

Perfusion: No wedge shaped peripheral perfusion defects to suggest
acute pulmonary embolism.
IMPRESSION: 1. No evidence for pulmonary embolus.

## 2019-11-06 ENCOUNTER — Ambulatory Visit (INDEPENDENT_AMBULATORY_CARE_PROVIDER_SITE_OTHER): Payer: Self-pay | Admitting: *Deleted

## 2019-11-06 DIAGNOSIS — I442 Atrioventricular block, complete: Secondary | ICD-10-CM

## 2019-11-07 LAB — CUP PACEART REMOTE DEVICE CHECK
Battery Remaining Longevity: 121 mo
Battery Voltage: 2.94 V
Brady Statistic AP VP Percent: 43.09 %
Brady Statistic AP VS Percent: 0 %
Brady Statistic AS VP Percent: 56.9 %
Brady Statistic AS VS Percent: 0.01 %
Brady Statistic RA Percent Paced: 43.03 %
Brady Statistic RV Percent Paced: 99.99 %
Date Time Interrogation Session: 20210622164041
Implantable Lead Implant Date: 20181229
Implantable Lead Implant Date: 20181229
Implantable Lead Location: 753859
Implantable Lead Location: 753860
Implantable Lead Model: 5076
Implantable Lead Model: 5076
Implantable Pulse Generator Implant Date: 20181229
Lead Channel Impedance Value: 323 Ohm
Lead Channel Impedance Value: 418 Ohm
Lead Channel Impedance Value: 437 Ohm
Lead Channel Impedance Value: 570 Ohm
Lead Channel Pacing Threshold Amplitude: 0.5 V
Lead Channel Pacing Threshold Amplitude: 0.5 V
Lead Channel Pacing Threshold Pulse Width: 0.4 ms
Lead Channel Pacing Threshold Pulse Width: 0.4 ms
Lead Channel Sensing Intrinsic Amplitude: 3.625 mV
Lead Channel Sensing Intrinsic Amplitude: 3.625 mV
Lead Channel Sensing Intrinsic Amplitude: 7.25 mV
Lead Channel Sensing Intrinsic Amplitude: 7.25 mV
Lead Channel Setting Pacing Amplitude: 2 V
Lead Channel Setting Pacing Amplitude: 2 V
Lead Channel Setting Pacing Pulse Width: 0.4 ms
Lead Channel Setting Sensing Sensitivity: 1.2 mV

## 2019-11-08 NOTE — Progress Notes (Signed)
Remote pacemaker transmission.   

## 2020-02-05 ENCOUNTER — Ambulatory Visit (INDEPENDENT_AMBULATORY_CARE_PROVIDER_SITE_OTHER): Payer: Self-pay | Admitting: *Deleted

## 2020-02-05 DIAGNOSIS — I442 Atrioventricular block, complete: Secondary | ICD-10-CM

## 2020-02-06 LAB — CUP PACEART REMOTE DEVICE CHECK
Battery Remaining Longevity: 115 mo
Battery Voltage: 2.94 V
Brady Statistic AP VP Percent: 49.15 %
Brady Statistic AP VS Percent: 0 %
Brady Statistic AS VP Percent: 50.85 %
Brady Statistic AS VS Percent: 0.01 %
Brady Statistic RA Percent Paced: 49.09 %
Brady Statistic RV Percent Paced: 99.99 %
Date Time Interrogation Session: 20210922164421
Implantable Lead Implant Date: 20181229
Implantable Lead Implant Date: 20181229
Implantable Lead Location: 753859
Implantable Lead Location: 753860
Implantable Lead Model: 5076
Implantable Lead Model: 5076
Implantable Pulse Generator Implant Date: 20181229
Lead Channel Impedance Value: 342 Ohm
Lead Channel Impedance Value: 399 Ohm
Lead Channel Impedance Value: 437 Ohm
Lead Channel Impedance Value: 532 Ohm
Lead Channel Pacing Threshold Amplitude: 0.5 V
Lead Channel Pacing Threshold Amplitude: 0.75 V
Lead Channel Pacing Threshold Pulse Width: 0.4 ms
Lead Channel Pacing Threshold Pulse Width: 0.4 ms
Lead Channel Sensing Intrinsic Amplitude: 4.125 mV
Lead Channel Sensing Intrinsic Amplitude: 4.125 mV
Lead Channel Sensing Intrinsic Amplitude: 7.25 mV
Lead Channel Sensing Intrinsic Amplitude: 7.25 mV
Lead Channel Setting Pacing Amplitude: 2 V
Lead Channel Setting Pacing Amplitude: 2 V
Lead Channel Setting Pacing Pulse Width: 0.4 ms
Lead Channel Setting Sensing Sensitivity: 1.2 mV

## 2020-02-08 NOTE — Progress Notes (Signed)
Remote pacemaker transmission.   

## 2020-05-07 ENCOUNTER — Ambulatory Visit (INDEPENDENT_AMBULATORY_CARE_PROVIDER_SITE_OTHER): Payer: Self-pay

## 2020-05-07 DIAGNOSIS — I442 Atrioventricular block, complete: Secondary | ICD-10-CM

## 2020-05-09 LAB — CUP PACEART REMOTE DEVICE CHECK
Battery Remaining Longevity: 110 mo
Battery Voltage: 2.94 V
Brady Statistic AP VP Percent: 48.22 %
Brady Statistic AP VS Percent: 0.01 %
Brady Statistic AS VP Percent: 51.75 %
Brady Statistic AS VS Percent: 0.02 %
Brady Statistic RA Percent Paced: 48.18 %
Brady Statistic RV Percent Paced: 99.97 %
Date Time Interrogation Session: 20211222154018
Implantable Lead Implant Date: 20181229
Implantable Lead Implant Date: 20181229
Implantable Lead Location: 753859
Implantable Lead Location: 753860
Implantable Lead Model: 5076
Implantable Lead Model: 5076
Implantable Pulse Generator Implant Date: 20181229
Lead Channel Impedance Value: 304 Ohm
Lead Channel Impedance Value: 361 Ohm
Lead Channel Impedance Value: 418 Ohm
Lead Channel Impedance Value: 475 Ohm
Lead Channel Pacing Threshold Amplitude: 0.5 V
Lead Channel Pacing Threshold Amplitude: 0.5 V
Lead Channel Pacing Threshold Pulse Width: 0.4 ms
Lead Channel Pacing Threshold Pulse Width: 0.4 ms
Lead Channel Sensing Intrinsic Amplitude: 11.25 mV
Lead Channel Sensing Intrinsic Amplitude: 11.25 mV
Lead Channel Sensing Intrinsic Amplitude: 3 mV
Lead Channel Sensing Intrinsic Amplitude: 3 mV
Lead Channel Setting Pacing Amplitude: 2 V
Lead Channel Setting Pacing Amplitude: 2 V
Lead Channel Setting Pacing Pulse Width: 0.4 ms
Lead Channel Setting Sensing Sensitivity: 1.2 mV

## 2020-05-21 NOTE — Progress Notes (Signed)
Remote pacemaker transmission.
